# Patient Record
Sex: Male | Born: 1990 | Hispanic: No | Marital: Married | Smoking: Never smoker
Health system: Southern US, Community
[De-identification: ages and names within clinical notes are randomized; demographics above are authoritative.]

## PROBLEM LIST (undated history)

## (undated) DIAGNOSIS — R1013 Epigastric pain: Secondary | ICD-10-CM

## (undated) DIAGNOSIS — A048 Other specified bacterial intestinal infections: Secondary | ICD-10-CM

## (undated) DIAGNOSIS — K5900 Constipation, unspecified: Secondary | ICD-10-CM

## (undated) DIAGNOSIS — K219 Gastro-esophageal reflux disease without esophagitis: Secondary | ICD-10-CM

## (undated) DIAGNOSIS — J45909 Unspecified asthma, uncomplicated: Secondary | ICD-10-CM

## (undated) HISTORY — DX: Gastro-esophageal reflux disease without esophagitis: K21.9

## (undated) HISTORY — PX: NO PAST SURGERIES: SHX2092

## (undated) HISTORY — DX: Epigastric pain: R10.13

## (undated) HISTORY — DX: Constipation, unspecified: K59.00

## (undated) HISTORY — DX: Unspecified asthma, uncomplicated: J45.909

---

## 2020-03-06 LAB — COMPREHENSIVE METABOLIC PANEL
ALT: 15 U/L (ref 0–41)
AST: 18 U/L (ref 0–40)
Albumin/Globulin Ratio: 2.1 mmol/L (ref 1.00–2.70)
Albumin: 4.4 g/dL (ref 3.5–5.2)
Alk Phosphatase: 56 U/L (ref 40–130)
Anion Gap: 8 mmol/L (ref 2–17)
BUN: 9 mg/dL (ref 6–20)
CO2: 26 mmol/L (ref 22–29)
Calcium: 9.2 mg/dL (ref 8.6–10.0)
Chloride: 103 mmol/L (ref 98–107)
Creatinine: 0.6 mg/dL — ABNORMAL LOW (ref 0.7–1.3)
GFR African American: 160 mL/min/{1.73_m2} (ref >=90)
GFR Non-African American: 138 mL/min/{1.73_m2} (ref >=90)
Globulin: 2.1 g/dL (ref 1.9–4.4)
Glucose: 118 mg/dL — ABNORMAL HIGH (ref 70–99)
OSMOLALITY CALCULATED: 274 mOsm/kg (ref 270–287)
Potassium: 4.2 mmol/L (ref 3.5–5.3)
Sodium: 137 mmol/L (ref 135–145)
Total Bilirubin: 0.66 mg/dL (ref 0.00–1.20)
Total Protein: 6.5 g/dL (ref 6.4–8.3)

## 2020-03-06 LAB — CBC WITH AUTO DIFFERENTIAL
Absolute Baso #: 0 10*3/uL (ref 0.0–0.2)
Absolute Eos #: 0.3 10*3/uL (ref 0.0–0.5)
Absolute Lymph #: 1.9 10*3/uL (ref 1.0–3.2)
Absolute Mono #: 0.3 10*3/uL (ref 0.3–1.0)
Basophils %: 0.8 % (ref 0.0–2.0)
Eosinophils %: 5.7 % (ref 0.0–7.0)
Hematocrit: 44.9 % (ref 38.0–52.0)
Hemoglobin: 15.4 g/dL (ref 13.0–17.3)
Immature Grans (Abs): 0.01 10*3/uL (ref 0.00–0.06)
Immature Granulocytes: 0.2 % (ref 0.0–0.6)
Lymphocytes: 35.8 % (ref 15.0–45.0)
MCH: 27.5 pg (ref 27.0–34.5)
MCHC: 34.3 g/dL (ref 32.0–36.0)
MCV: 80 fL — ABNORMAL LOW (ref 84.0–100.0)
MPV: 10.7 fL (ref 7.2–13.2)
Monocytes: 5.5 % (ref 4.0–12.0)
NRBC Absolute: 0 10*3/uL (ref 0.000–0.012)
NRBC Automated: 0 % (ref 0.0–0.2)
Neutrophils %: 52 % (ref 42.0–74.0)
Neutrophils Absolute: 2.8 10*3/uL (ref 1.6–7.3)
Platelets: 254 10*3/uL (ref 140–440)
RBC: 5.61 x10e6/mcL — ABNORMAL HIGH (ref 4.00–5.60)
RDW: 13.2 % (ref 11.0–16.0)
WBC: 5.3 10*3/uL (ref 3.8–10.6)

## 2020-03-06 LAB — LIPASE: Lipase: 39 U/L (ref 13–60)

## 2020-03-06 NOTE — Discharge Summary (Signed)
 ED Clinical Summary                        [facility]  [address]  [place], [place], [address]  [phone]           PERSON INFORMATION  Name: Rodney Stephens, Rodney Stephens Age:  30 Years DOB: [birth year]   Sex: Male Language: Other PCP: PCP,  NONE   Marital Status: Married Phone: [phone] Med Service: [facility]   MRN: [id number] Acct# [id number] Arrival: 03/06/2020 17:17:00   Visit Reason: Abdominal pain; ABD PAIN/NO ENGLISH Acuity: 3 LOS: 000 02:00   Address:    [address] [place] [address]   Diagnosis:    Gastritis; Pain in the abdomen  Medications:          New Medications  Printed Prescriptions  sucralfate (Carafate 1 g/10 mL oral suspension) 10 Milliliter Oral (given by mouth) four times a day (before meals and at bedtime) for 7 Days. Refills: 0.  Last Dose:____________________  Medications That Were Updated - Follow Current Instructions  Printed Prescriptions  Current omeprazole (omeprazole 20 mg oral delayed release capsule) 1 Capsules Oral (given by mouth) every day. Refills: 2.  Last Dose:____________________    Other Medications  Current omeprazole Oral (given by mouth) every day.  Last Dose:____________________        Medications Administered During Visit:                Medication Dose Route   ketorolac 15 mg IV Push   Sodium Chloride 0.9% 500 mL IV Piggyback   lidocaine topical 15 mL Oral   Al hydroxide/Mg hydroxide/simethicone 30 mL Oral   famotidine 20 mg IV Push               Allergies      No Known Medication Allergies      Major Tests and Procedures:  The following procedures and tests were performed during your ED visit.  COMMON PROCEDURES%>  COMMON PROCEDURES COMMENTS%>                PROVIDER INFORMATION               Provider Role Assigned Sampson Prey, RN, Donald ED Nurse 03/06/2020 17:28:05    GAETANO NETTER E-MD ED Provider 03/06/2020 17:29:56        Attending Physician:  GAETANO NETTER E-MD      Admit Doc  GAETANO NETTER E-MD      Consulting Doc       VITALS INFORMATION  Vital Sign Triage Latest   Temp Oral ORAL_1%> ORAL%>   Temp Temporal TEMPORAL_1%> TEMPORAL%>   Temp Intravascular INTRAVASCULAR_1%> INTRAVASCULAR%>   Temp Axillary AXILLARY_1%> AXILLARY%>   Temp Rectal RECTAL_1%> RECTAL%>   02 Sat 99 % 98 %   Respiratory Rate RATE_1%> RATE%>   Peripheral Pulse Rate PULSE RATE_1%>69 bpm PULSE RATE%>   Apical Heart Rate HEART RATE_1%> HEART RATE%>   Blood Pressure BLOOD PRESSURE_1%>/ BLOOD PRESSURE_1%>92 mmHg BLOOD PRESSURE%> / BLOOD PRESSURE%>92 mmHg                 Immunizations      No Immunizations Documented This Visit          DISCHARGE INFORMATION   Discharge Disposition: H Outpt-Sent Home   Discharge Location:  Home   Discharge Date and Time:  03/06/2020 19:17:21   ED Checkout Date and Time:  03/06/2020 19:17:21  DEPART REASON INCOMPLETE INFORMATION               Depart Action Incomplete Reason   Interactive View/I&O Recently assessed               Problems      No Problems Documented              Smoking Status      No Smoking Status Documented         PATIENT EDUCATION INFORMATION  Instructions:     Peptic Ulcer(Arabic); Abdominal Pain, Adult(Arabic)     Follow up:                   With: Address: When:   [facility] Call for appt & office location   [phone] Business (1) Within 1 to 2 weeks       With: Address: When:   Follow up with your primary care doctor  Within 1 to 2 weeks   Comments:   For reassement and follow up. If you dont have a doctor please call [phone] and Florie will help you get one.              ED PROVIDER DOCUMENTATION     Patient:   Rodney Stephens             MRN: [id number]            FIN: [id number]               Age:   14 years     Sex:  Male     DOB:  April 17, 1990   Associated Diagnoses:   Pain in the abdomen; Gastritis   Author:   LAUERMAN,  NICHOLAS E-MD      Basic Information   Time seen: Provider Seen (ST)   ED Provider/Time:    GAETANO NETTER E-MD / 03/06/2020  17:29  .   History source: Patient.   Arrival mode: Walking, Ambulance-ALS.   History limitation: Language barrier, [employer] video interpreter used during history and exam and discussion of findings, plan and final dispostion of patient..   Additional information: Chief Complaint from Nursing Triage Note   Chief Complaint  Chief Complaint: Pt refers of increased abdominal pain. Causing headache and feeling increasingly weak for about 15 days after not taking medications (omeprazole). (03/06/20 17:20:00).      History of Present Illness   The patient presents with abdominal pain.  The onset was chronic and started 1.5 years ago. has been on medication until 15 days ago than symptoms started again. was on omeprazole.  The course/duration of symptoms is constant.  The character of symptoms is dull.  The degree at onset was moderate.  The Location of pain at onset was mid epigastric.  The degree at present is severe.  The Location of pain at present is mid epigastric.  Radiating pain: none. The exacerbating factor is eating.  Therapy today: none.  Associated symptoms: nausea, denies chest pain, denies vomiting, denies diarrhea, denies back pain, denies shortness of breath, denies fever and denies chills.        Review of Systems   Constitutional symptoms:  No fever,    Respiratory symptoms:  No shortness of breath, no cough.    Cardiovascular symptoms:  No chest pain,    Gastrointestinal symptoms:  Abdominal pain, nausea, no vomiting, no diarrhea.    Genitourinary symptoms:  No dysuria, no hematuria.    Musculoskeletal symptoms:  No back pain,              Additional review of systems information: All other systems reviewed and otherwise negative.      Health Status   Allergies:    Allergic Reactions (Selected)  No Known Medication Allergies.   Medications:  (Selected)   Inpatient Medications  Ordered  Pepcid: 20 mg, 2 mL, IV Push, Once  Sodium Chloride 0.9% bolus: 500 mL, 500 mL/hr, IV Piggyback, Once  Toradol: 15 mg, 1  mL, IV Push, Once  aluminum hydroxide/magnesium hydroxide/simethicone 200 mg-200 mg-20 mg/5 mL oral suspension: 30 mL, Oral, Once  lidocaine 2% mucous membrane solution: 15 mL, Oral, Once  Documented Medications  Documented  omeprazole: Oral, Daily, 0 Refill(s).      Past Medical/ Family/ Social History   Medical history:    No active or resolved past medical history items have been selected or recorded., Reviewed as documented in chart.   Surgical history:    No active procedure history items have been selected or recorded., Reviewed as documented in chart.   Family history:    No family history items have been selected or recorded..   Social history:    Social & Psychosocial Habits    No Data Available  , Reviewed as documented in chart.   Problem list:    No qualifying data available  , per nurse's notes.      Physical Examination               Vital Signs   Vital Signs   03/06/2020 17:20 EST Systolic Blood Pressure 134 mmHg    Diastolic Blood Pressure 92 mmHg  HI    Temperature Oral 36.8 degC    Heart Rate Monitored 75 bpm    Respiratory Rate 14 br/min    SpO2 99 %   .   Measurements   03/06/2020 17:27 EST Body Mass Index est meas 22.21 kg/m2    Body Mass Index Measured 22.21 kg/m2   03/06/2020 17:20 EST Height/Length Measured 172 cm    Weight Dosing 65.7 kg   .   Basic Oxygen Information   03/06/2020 17:20 EST Oxygen Therapy Room air    SpO2 99 %   .   General:  Alert, no acute distress.    Skin:  Warm, dry, pink, intact.    Head:  Normocephalic, atraumatic.    Neck:  Supple.   Eye:  Extraocular movements are intact.   Cardiovascular:  Regular rate and rhythm, No murmur, Normal peripheral perfusion.    Respiratory:  Lungs are clear to auscultation, respirations are non-labored.    Gastrointestinal:  Soft, Nontender, Non distended, Guarding: Negative, Rebound: Negative.    Back:  Nontender, Normal range of motion, no cvat.    Neurological:  Alert and oriented to person, place, time, and situation, No focal neurological  deficit observed.    Psychiatric:  Cooperative.      Medical Decision Making   Differential Diagnosis:  Abdominal pain, hepatitis, pancreatitis, gastroesophageal reflux disease, peptic ulcer disease, gastritis.    Rationale:  Chronic abdominal pain for last year and a half worse with eating in the epigastrium it sounds like reflux gastritis or peptic ulcer disease.  He was on omeprazole 15 days ago he ran out pain became worse.  He was better when he was on the medicine.  He has a benign abdominal exam with no tenderness.  Given the length of symptoms his benign exam I do not think he  needs any emergent imaging.  Plan for Pepcid and a GI cocktail here we will check baseline labs since we have never seen him before and plan for outpatient referral to PCP and GI.SABRA   Documents reviewed:  Emergency department nurses' notes, vital signs.    Results review:  Lab results : Lab View   03/06/2020 18:31 EST Estimated Creatinine Clearance 171.85 mL/min   03/06/2020 17:34 EST WBC 5.3 x10e3/mcL    RBC 5.61 x10e6/mcL  HI    Hgb 15.4 g/dL    HCT 55.0 %    MCV 19.9 fL  LOW    MCH 27.5 pg    MCHC 34.3 g/dL    RDW 86.7 %    Platelet 254 x10e3/mcL    MPV 10.7 fL    Neutro Auto 52.0 %    Neutro Absolute 2.8 x10e3/mcL    Immature Grans Percent 0.2 %    Immature Grans Absolute 0.01 x10e3/mcL    Lymph Auto 35.8 %    Lymph Absolute 1.9 x10e3/mcL    Mono Auto 5.5 %    Mono Absolute 0.3 x10e3/mcL    Eosinophil Percent 5.7 %    Eos Absolute 0.3 x10e3/mcL    Basophil Auto 0.8 %    Baso Absolute 0.0 x10e3/mcL    NRBC Absolute Auto 0.000 x10e3/mcL    NRBC Percent Auto 0.0 %    Sodium Lvl 137 mmol/L    Potassium Lvl 4.2 mmol/L    Chloride 103 mmol/L    CO2 26 mmol/L    Glucose Random 118 mg/dL  HI    BUN 9 mg/dL    Creatinine Lvl 0.6 mg/dL  LOW    AGAP 8 mmol/L    Osmolality Calc 274 mOsm/kg    Calcium Lvl 9.2 mg/dL    Protein Total 6.5 g/dL    Albumin Lvl 4.4 g/dL    Globulin Calc 2.1 g/dL    AG Ratio Calc 7.89 mmol/L    Alk Phos 56 unit/L    AST 18  unit/L    ALT 15 unit/L    eGFR AA 160 mL/min/1.41m    eGFR Non-AA 138 mL/min/1.27m    Bili Total 0.66 mg/dL    Lipase Lvl 39 unit/L   , Interpretation Normal results.       Reexamination/ Reevaluation   Vital signs   Basic Oxygen Information   03/06/2020 17:20 EST Oxygen Therapy Room air    SpO2 99 %         Impression and Plan   Diagnosis   Pain in the abdomen (ICD10-CM R10.9, Discharge, Medical)   Gastritis (ICD10-CM K29.70, Discharge, Medical)   Plan   Disposition: Discharged: to home.    Patient was given the following educational materials: Abdominal Pain, Adult(Arabic), Peptic Ulcer(Arabic).    Follow up with: Follow up with your primary care doctor Within 1 to 2 weeks For reassement and follow up. If you dont have a doctor please call [phone] and Florie will help you get one.; [facility] Within 1 to 2 weeks.    Counseled: Based on the patient's history, exam, and diagnostic evaluation, there is no indication for emergent surgery or inpatient treatment. It is understood by the patient/guardian that if the symptoms persist or worsen they need to return immediately for re-evaluation.SABRA

## 2020-03-06 NOTE — ED Notes (Signed)
 ED Patient Summary              [facility] Emergency Department  [address], [place] [address]  [phone]  Discharge Instructions (Patient)  Name: Rodney Stephens, Rodney Stephens  DOB:  [birth year]                   MRN: [id number]                   FIN: [id number]  Reason For Visit: Abdominal pain; ABD PAIN/NO ENGLISH  Final Diagnosis: Gastritis; Pain in the abdomen     Visit Date: 03/06/2020 17:17:00  Address: [address] [place] [address]  Phone: [phone]     Emergency Department Providers:         Primary Physician:      LAUERMAN, NICHOLAS E         [facility] would like to thank you for allowing us  to assist you with your healthcare needs. The following includes patient education materials and information regarding your injury/illness.     Follow-up Instructions:  You were seen today on an emergency basis. Please contact your primary care doctor for a follow up appointment. If you received a referral to a specialist doctor, it is important you follow-up as instructed.    It is important that you call your follow-up doctor to schedule and confirm the location of your next appointment. Your doctor may practice at multiple locations. The office location of your follow-up appointment may be different to the one written on your discharge instructions.    If you do not have a primary care doctor, please call [phone] for help in finding a [facility] Provider. For help in finding a specialist doctor, please call [phone].    If your condition gets worse before your follow-up with your primary care doctor or specialist, please return to the Emergency Department.      Coronavirus 2019 (COVID-19) Reminders:     Patients age 6 - 17, with parental consent, and patients over age 44 can make an appointment for a COVID-19 vaccine. Patients can contact their [facility] doctors' offices to schedule an appointment to receive the COVID-19  vaccine. Patients who do not have a [facility] physician can call [phone] to schedule vaccination appointments.      Follow Up Appointments:  Primary Care Provider:     Name: PCP,  NONE     Phone:                  With: Address: When:   [facility] Call for appt & office location   [phone] Business (1) Within 1 to 2 weeks       With: Address: When:   Follow up with your primary care doctor  Within 1 to 2 weeks   Comments:   For reassement and follow up. If you dont have a doctor please call [phone] and Florie will help you get one.                   New Medications  Printed Prescriptions  sucralfate (Carafate 1 g/10 mL oral suspension) 10 Milliliter Oral (given by mouth) four times a day (before meals and at bedtime) for 7 Days. Refills: 0.  Last Dose:____________________  Medications That Were Updated - Follow Current Instructions  Printed Prescriptions  Current omeprazole (omeprazole 20 mg oral delayed release capsule) 1 Capsules Oral (given  by mouth) every day. Refills: 2.  Last Dose:____________________    Other Medications  Current omeprazole Oral (given by mouth) every day.  Last Dose:____________________        Allergy Info: No Known Medication Allergies     Discharge Additional Information          Discharge Patient 03/06/20 18:44:00 EST      Patient Education Materials:       ---------------------------------------------------------------------------------------------------------------------  [facility]) encourages you to self-enroll in the [facility] Patient Portal.  [facility] Patient Portal will allow you to manage your personal health information securely from your own electronic device now and in the future.  To begin your Patient Portal enrollment process, please visit [email or web address] Click on "Sign up now" under [facility].  If you find that you need additional assistance on the [facility]  Patient Portal or need a copy of your medical records, please call the [facility] Medical Records Office at [phone].  Comment:

## 2020-03-06 NOTE — ED Notes (Signed)
ED Triage Note       ED Secondary Triage Entered On:  03/06/2020 17:29 EST    Performed On:  03/06/2020 17:28 EST by Buiocchi, RN, Shawna Clamp Information   Barriers to Learning :   Language barrier   COVID-19 Vaccine Status :   2 Doses received   ED Home Meds Section :   Document assessment   [facility] ED Fall Risk Section :   Document assessment   ED Advance Directives Section :   Document assessment   ED Palliative Screen :   N/A (prefilled for <28yo)   Buiocchi, RN, Jill Side - 03/06/2020 17:28 EST   (As Of: 03/06/2020 17:29:42 EST)   Diagnoses(Active)    Abdominal pain  Date:   03/06/2020 ; Diagnosis Type:   Reason For Visit ; Confirmation:   Complaint of ; Clinical Dx:   Abdominal pain ; Classification:   Medical ; Clinical Service:   Emergency medicine ; Code:   PNED ; Probability:   0 ; Diagnosis Code:   [id number]             -    Procedure History   (As Of: 03/06/2020 17:29:42 EST)     [facility] Fall Risk Assessment Tool   Hx of falling last 3 months ED Fall :   No   Patient confused or disoriented ED Fall :   No   Patient intoxicated or sedated ED Fall :   No   Patient impaired gait ED Fall :   No   Use a mobility assistance device ED Fall :   No   Patient altered elimination ED Fall :   No   [facility] ED Fall Score :   0    Buiocchi, RNJill Side - 03/06/2020 17:28 EST   ED Advance Directive   Advance Directive :   No   Buiocchi, RN, Jill Side - 03/06/2020 17:28 EST

## 2020-03-06 NOTE — ED Notes (Signed)
ED Patient Education Note     Patient Education Materials Follows:

## 2020-03-06 NOTE — ED Notes (Signed)
ED Pre-Arrival Note        Pre-Arrival Summary    Name:  cc6,    Current Date:  03/06/2020 17:19:59 EST  Gender:  Male  Date of Birth:    Age:  30  Pre-Arrival Type:  EMS  ETA:  03/06/2020 17:36:00 EST  Primary Care Physician:    Presenting Problem:  abd pain/ no english  Pre-Arrival User:  Buiocchi, RN, Jill Side  Referring Source:    Location:  [place]  BP:  145/84  HR:  75  Resp:  13  O2:  96RA            PreArrival Communication Form  Emergency Department        Additional Patient Information:        Orders:  [    ] CBC                                            [     ] CT Head no contrast  [    ] BMP                                           [     ] CT Abdomen/Pelvis no contrast  [    ] PT/INR                                       [     ] CT Abdomen/Pelvis IV contrast, w/ oral contrast  [    ] Troponin                                   [     ] CT Abdomen/Pelvis IV contrast, no oral contrast  [    ] BNP                                            [     ] See ordersheet  [    ] CXR                                             [     ] Other:__________________________  [    ] EKG

## 2020-03-06 NOTE — ED Provider Notes (Signed)
Abdominal Pain *ED        Patient:   Rodney Stephens, Rodney Stephens             MRN: [id number]            FIN: [id number]               Age:   30 years     Sex:  Male     DOB:  [birth year]   Associated Diagnoses:   Pain in the abdomen; Gastritis   Author:   Rodolph Bong E-MD      Basic Information   Time seen: Provider Seen (ST)   ED Provider/Time:    Rodolph Bong E-MD / 03/06/2020 17:29  .   History source: Patient.   Arrival mode: Walking, Ambulance-ALS.   History limitation: Language barrier, [employer] video interpreter used during history and exam and discussion of findings, plan and final dispostion of patient..   Additional information: Chief Complaint from Nursing Triage Note   Chief Complaint  Chief Complaint: Pt refers of increased abdominal pain. Causing headache and feeling increasingly weak for about 15 days after not taking medications (omeprazole). (03/06/20 17:20:00).      History of Present Illness   The patient presents with abdominal pain.  The onset was chronic and started 1.5 years ago. has been on medication until 15 days ago than symptoms started again. was on omeprazole.  The course/duration of symptoms is constant.  The character of symptoms is dull.  The degree at onset was moderate.  The Location of pain at onset was mid epigastric.  The degree at present is severe.  The Location of pain at present is mid epigastric.  Radiating pain: none. The exacerbating factor is eating.  Therapy today: none.  Associated symptoms: nausea, denies chest pain, denies vomiting, denies diarrhea, denies back pain, denies shortness of breath, denies fever and denies chills.        Review of Systems   Constitutional symptoms:  No fever,    Respiratory symptoms:  No shortness of breath, no cough.    Cardiovascular symptoms:  No chest pain,    Gastrointestinal symptoms:  Abdominal pain, nausea, no vomiting, no diarrhea.    Genitourinary symptoms:  No dysuria, no hematuria.    Musculoskeletal symptoms:  No back pain,               Additional review of systems information: All other systems reviewed and otherwise negative.      Health Status   Allergies:    Allergic Reactions (Selected)  No Known Medication Allergies.   Medications:  (Selected)   Inpatient Medications  Ordered  Pepcid: 20 mg, 2 mL, IV Push, Once  Sodium Chloride 0.9% bolus: 500 mL, 500 mL/hr, IV Piggyback, Once  Toradol: 15 mg, 1 mL, IV Push, Once  aluminum hydroxide/magnesium hydroxide/simethicone 200 mg-200 mg-20 mg/5 mL oral suspension: 30 mL, Oral, Once  lidocaine 2% mucous membrane solution: 15 mL, Oral, Once  Documented Medications  Documented  omeprazole: Oral, Daily, 0 Refill(s).      Past Medical/ Family/ Social History   Medical history:    No active or resolved past medical history items have been selected or recorded., Reviewed as documented in chart.   Surgical history:    No active procedure history items have been selected or recorded., Reviewed as documented in chart.   Family history:    No family history items have been selected or recorded..   Social history:  Social & Psychosocial Habits    No Data Available  , Reviewed as documented in chart.   Problem list:    No qualifying data available  , per nurse's notes.      Physical Examination               Vital Signs   Vital Signs   03/06/2020 17:20 EST Systolic Blood Pressure 134 mmHg    Diastolic Blood Pressure 92 mmHg  HI    Temperature Oral 36.8 degC    Heart Rate Monitored 75 bpm    Respiratory Rate 14 br/min    SpO2 99 %   .   Measurements   03/06/2020 17:27 EST Body Mass Index est meas 22.21 kg/m2    Body Mass Index Measured 22.21 kg/m2   03/06/2020 17:20 EST Height/Length Measured 172 cm    Weight Dosing 65.7 kg   .   Basic Oxygen Information   03/06/2020 17:20 EST Oxygen Therapy Room air    SpO2 99 %   .   General:  Alert, no acute distress.    Skin:  Warm, dry, pink, intact.    Head:  Normocephalic, atraumatic.    Neck:  Supple.   Eye:  Extraocular movements are intact.   Cardiovascular:   Regular rate and rhythm, No murmur, Normal peripheral perfusion.    Respiratory:  Lungs are clear to auscultation, respirations are non-labored.    Gastrointestinal:  Soft, Nontender, Non distended, Guarding: Negative, Rebound: Negative.    Back:  Nontender, Normal range of motion, no cvat.    Neurological:  Alert and oriented to person, place, time, and situation, No focal neurological deficit observed.    Psychiatric:  Cooperative.      Medical Decision Making   Differential Diagnosis:  Abdominal pain, hepatitis, pancreatitis, gastroesophageal reflux disease, peptic ulcer disease, gastritis.    Rationale:  Chronic abdominal pain for last year and a half worse with eating in the epigastrium it sounds like reflux gastritis or peptic ulcer disease.  He was on omeprazole 15 days ago he ran out pain became worse.  He was better when he was on the medicine.  He has a benign abdominal exam with no tenderness.  Given the length of symptoms his benign exam I do not think he needs any emergent imaging.  Plan for Pepcid and a GI cocktail here we will check baseline labs since we have never seen him before and plan for outpatient referral to PCP and GI.Marland Kitchen   Documents reviewed:  Emergency department nurses' notes, vital signs.    Results review:  Lab results : Lab View   03/06/2020 18:31 EST Estimated Creatinine Clearance 171.85 mL/min   03/06/2020 17:34 EST WBC 5.3 x10e3/mcL    RBC 5.61 x10e6/mcL  HI    Hgb 15.4 g/dL    HCT 11.9 %    MCV 14.7 fL  LOW    MCH 27.5 pg    MCHC 34.3 g/dL    RDW 82.9 %    Platelet 254 x10e3/mcL    MPV 10.7 fL    Neutro Auto 52.0 %    Neutro Absolute 2.8 x10e3/mcL    Immature Grans Percent 0.2 %    Immature Grans Absolute 0.01 x10e3/mcL    Lymph Auto 35.8 %    Lymph Absolute 1.9 x10e3/mcL    Mono Auto 5.5 %    Mono Absolute 0.3 x10e3/mcL    Eosinophil Percent 5.7 %    Eos Absolute 0.3 x10e3/mcL  Basophil Auto 0.8 %    Baso Absolute 0.0 x10e3/mcL    NRBC Absolute Auto 0.000 x10e3/mcL    NRBC Percent  Auto 0.0 %    Sodium Lvl 137 mmol/L    Potassium Lvl 4.2 mmol/L    Chloride 103 mmol/L    CO2 26 mmol/L    Glucose Random 118 mg/dL  HI    BUN 9 mg/dL    Creatinine Lvl 0.6 mg/dL  LOW    AGAP 8 mmol/L    Osmolality Calc 274 mOsm/kg    Calcium Lvl 9.2 mg/dL    Protein Total 6.5 g/dL    Albumin Lvl 4.4 g/dL    Globulin Calc 2.1 g/dL    AG Ratio Calc 2.10 mmol/L    Alk Phos 56 unit/L    AST 18 unit/L    ALT 15 unit/L    eGFR AA 160 mL/min/1.7m???    eGFR Non-AA 138 mL/min/1.735m??    Bili Total 0.66 mg/dL    Lipase Lvl 39 unit/L   , Interpretation Normal results.       Reexamination/ Reevaluation   Vital signs   Basic Oxygen Information   03/06/2020 17:20 EST Oxygen Therapy Room air    SpO2 99 %         Impression and Plan   Diagnosis   Pain in the abdomen (ICD10-CM R10.9, Discharge, Medical)   Gastritis (ICD10-CM K29.70, Discharge, Medical)   Plan   Disposition: Discharged: to home.    Patient was given the following educational materials: Abdominal Pain, Adult(Arabic), Peptic Ulcer(Arabic).    Follow up with: Follow up with your primary care doctor Within 1 to 2 weeks For reassement and follow up. If you dont have a doctor please call [phone] and RoNestor Lewandowskyill help you get one.; [facility] Within 1 to 2 weeks.    Counseled: Based on the patient's history, exam, and diagnostic evaluation, there is no indication for emergent surgery or inpatient treatment. It is understood by the patient/guardian that if the symptoms persist or worsen they need to return immediately for re-evaluation.. Designer, industrial/productigned on 03/06/2020 06:44 PM EST   ________________________________________________   LARodolph Bong-MD               Modified by: LARodolph Bong-MD on 03/06/2020 05:48 PM EST      Modified by: LARodolph Bong-MD on 03/06/2020 06:44 PM EST

## 2020-03-06 NOTE — ED Notes (Signed)
ED Triage Note       ED Triage Adult Entered On:  03/06/2020 17:27 EST    Performed On:  03/06/2020 17:20 EST by Luanna Salk E-RN               Triage   Numeric Rating Pain Scale :   8   Chief Complaint :   Pt refers of increased abdominal pain. Causing headache and feeling increasingly weak for about 15 days after not taking medications (omeprazole).   [place] Mode of Arrival :   Ambulance   Infectious Disease Documentation :   Document assessment   Temperature Oral :   36.8 degC(Converted to: 98.2 degF)    Heart Rate Monitored :   75 bpm   Respiratory Rate :   14 br/min   Systolic Blood Pressure :   134 mmHg   Diastolic Blood Pressure :   92 mmHg (HI)    SpO2 :   99 %   Oxygen Therapy :   Room air   Patient presentation :   None of the above   Chief Complaint or Presentation suggest infection :   No   Dosing Weight Obtained By :   Measured   Weight Dosing :   65.7 kg(Converted to: 144 lb 14 oz)    Height :   172 cm(Converted to: 5 ft 8 in)    Body Mass Index Dosing :   22 kg/m2   Luanna Salk E-RN - 03/06/2020 17:20 EST   DCP GENERIC CODE   Tracking Acuity :   3   Tracking Group :   ED [employer]   [place],  [place] E-RN - 03/06/2020 17:20 EST   ED General Section :   Document assessment   Pregnancy Status :   Patient denies   Approximate Last Menstrual Period :   Male patient   ED Allergies Section :   Document assessment   ED Reason for Visit Section :   Document assessment   ED Home Meds Section :   Document assessment   Luanna Salk E-RN - 03/06/2020 17:20 EST   PTA/Triage Treatments   ED PTA Pre-Arrival Service :   [facility]   [place],  [place] E-RN - 03/06/2020 17:20 EST   ID Risk Screen Symptoms   Recent Travel History :   No recent travel   Close Contact with COVID-19 ID :   No   Last 14 days COVID-19 ID :   No   TB Symptom Screen :   No symptoms   C. diff Symptom/History ID :   Neither of the above   [place],  [place] E-RN - 03/06/2020 17:20 EST   Allergies   (As Of: 03/06/2020 17:27:21  EST)   Allergies (Active)   No Known Medication Allergies  Estimated Onset Date:   Unspecified ; Created By:   Luanna Salk E-RN; Reaction Status:   Active ; Category:   Drug ; Substance:   No Known Medication Allergies ; Type:   Allergy ; Updated By:   Luanna Salk E-RN; Reviewed Date:   03/06/2020 17:27 EST        Psycho-Social   Last 3 mo, thoughts killing self/others :   Patient denies   Right click within box for Suspected Abuse policy link. :   None   Feels Safe Where Live :   Yes   ED Behavioral Activity Rating Scale :   4 - Quiet  and awake (normal level of activity)   Luanna Salk E-RN - 03/06/2020 17:20 EST   ED Home Med List   Medication List   (As Of: 03/06/2020 17:27:21 EST)   Home Meds    omeprazole  :   omeprazole ; Status:   Documented ; Ordered As Mnemonic:   omeprazole ; Simple Display Line:   Oral, Daily, 0 Refill(s) ; Catalog Code:   omeprazole ; Order Dt/Tm:   03/06/2020 17:24:28 EST            ED Reason for Visit   (As Of: 03/06/2020 17:27:22 EST)   Diagnoses(Active)    Abdominal pain  Date:   03/06/2020 ; Diagnosis Type:   Reason For Visit ; Confirmation:   Complaint of ; Clinical Dx:   Abdominal pain ; Classification:   Medical ; Clinical Service:   Emergency medicine ; Code:   PNED ; Probability:   0 ; Diagnosis Code:   [id number]

## 2020-06-28 LAB — CBC WITH AUTO DIFFERENTIAL
Absolute Baso #: 0 10*3/uL (ref 0.0–0.2)
Absolute Eos #: 0.1 10*3/uL (ref 0.0–0.5)
Absolute Lymph #: 1.2 10*3/uL (ref 1.0–3.2)
Absolute Mono #: 0.2 10*3/uL — ABNORMAL LOW (ref 0.3–1.0)
Basophils %: 0.9 % (ref 0.0–2.0)
Eosinophils %: 4.3 % (ref 0.0–7.0)
Hematocrit: 45 % (ref 38.0–52.0)
Hemoglobin: 15.6 g/dL (ref 13.0–17.3)
Immature Grans (Abs): 0.01 10*3/uL (ref 0.00–0.06)
Immature Granulocytes: 0.3 % (ref 0.0–0.6)
Lymphocytes: 35.8 % (ref 15.0–45.0)
MCH: 28.4 pg (ref 27.0–34.5)
MCHC: 34.7 g/dL (ref 32.0–36.0)
MCV: 81.8 fL — ABNORMAL LOW (ref 84.0–100.0)
MPV: 10.3 fL (ref 7.2–13.2)
Monocytes: 5.2 % (ref 4.0–12.0)
NRBC Absolute: 0 10*3/uL (ref 0.000–0.012)
NRBC Automated: 0 % (ref 0.0–0.2)
Neutrophils %: 53.5 % (ref 42.0–74.0)
Neutrophils Absolute: 1.8 10*3/uL (ref 1.6–7.3)
Platelets: 239 10*3/uL (ref 140–440)
RBC: 5.5 x10e6/mcL (ref 4.00–5.60)
RDW: 12.4 % (ref 11.0–16.0)
WBC: 3.3 10*3/uL — ABNORMAL LOW (ref 3.8–10.6)

## 2020-06-28 LAB — LACTIC ACID: Lactic Acid: 0.8 mmol/L (ref 0.5–2.0)

## 2020-06-28 LAB — COMPREHENSIVE METABOLIC PANEL
ALT: 40 U/L (ref 0–50)
AST: 32 U/L (ref 0–50)
Albumin/Globulin Ratio: 2.06 (ref 1.00–2.70)
Albumin: 4.7 g/dL (ref 3.5–5.2)
Alk Phosphatase: 56 U/L (ref 40–130)
Anion Gap: 9 mmol/L (ref 2–17)
BUN: 18 mg/dL (ref 6–20)
CO2: 27 mmol/L (ref 22–29)
Calcium: 9.4 mg/dL (ref 8.6–10.0)
Chloride: 100 mmol/L (ref 98–107)
Creatinine: 0.7 mg/dL (ref 0.7–1.3)
Est, Glom Filt Rate: 130 mL/min/1.73m?? (ref >=90)
Globulin: 2.3 g/dL (ref 1.9–4.4)
Glucose: 114 mg/dL — ABNORMAL HIGH (ref 70–99)
OSMOLALITY CALCULATED: 274 mOsm/kg (ref 270–287)
Potassium: 4.6 mmol/L (ref 3.5–5.3)
Sodium: 136 mmol/L (ref 135–145)
Total Bilirubin: 0.71 mg/dL (ref 0.00–1.20)
Total Protein: 7 g/dL (ref 6.4–8.3)

## 2020-06-28 LAB — LIPASE: Lipase: 41 U/L (ref 13–60)

## 2020-06-28 NOTE — ED Notes (Signed)
ED Triage Note       ED Secondary Triage Entered On:  06/28/2020 13:41 EDT    Performed On:  06/28/2020 13:40 EDT by Everardo All, RN, Devonne Doughty               General Information   Barriers to Learning :   None evident   Languages :   English   ED Home Meds Section :   Document assessment   [facility] ED Fall Risk Section :   Document assessment   ED Advance Directives Section :   Document assessment   ED Palliative Screen :   N/A (prefilled for <65yo)   Everardo All RN, Devonne Doughty - 06/28/2020 13:40 EDT   (As Of: 06/28/2020 13:41:39 EDT)   Problems(Active)    Asthma (SNOMED CT  :[id number] )  Name of Problem:   Asthma ; Recorder:   Everardo All RN, Devonne Doughty; Confirmation:   Confirmed ; Classification:   Patient Stated ; Code:   [id number] ; Contributor System:   Dietitian ; Last Updated:   06/28/2020 13:41 EDT ; Life Cycle Date:   06/28/2020 ; Life Cycle Status:   Active ; Vocabulary:   SNOMED CT          Diagnoses(Active)    Abdominal pain  Date:   06/28/2020 ; Diagnosis Type:   Reason For Visit ; Confirmation:   Complaint of ; Clinical Dx:   Abdominal pain ; Classification:   Medical ; Clinical Service:   Emergency medicine ; Code:   PNED ; Probability:   0 ; Diagnosis Code:   [id number]             -    Procedure History   (As Of: 06/28/2020 13:41:39 EDT)     Phoe[facility] Perch Fall Risk Assessment Tool   Hx of falling last 3 months ED Fall :   No   Everardo All RN, Devonne Doughty - 06/28/2020 13:40 EDT   ED Advance Directive   Advance Directive :   No   Everardo All RN, Devonne Doughty - 06/28/2020 13:40 EDT   Med Hx   Medication List   (As Of: 06/28/2020 13:41:39 EDT)   Normal Order    iopamidol 61% Inj Soln 100 mL  :   iopamidol 61% Inj Soln 100 mL ; Status:   Ordered ; Ordered As Mnemonic:   Isovue-300 ; Simple Display Line:   100 mL, IV Contrast, Once ; Ordering Provider:   Levy Pupa CLIVE-MD; Catalog Code:   iopamidol ; Order Dt/Tm:   06/28/2020 12:37:58 EDT          Sodium Chloride 0.9% intravenous solution Bolus  :   Sodium Chloride 0.9%  intravenous solution Bolus ; Status:   Ordered ; Ordered As Mnemonic:   Sodium Chloride 0.9% bolus ; Simple Display Line:   1,000 mL, 1000 mL/hr, IV Piggyback, Once ; Ordering Provider:   GREAVES,  ROBERT CLIVE-MD; Catalog Code:   Sodium Chloride 0.9% ; Order Dt/Tm:   06/28/2020 12:31:35 EDT            Home Meds    fluticasone  :   fluticasone ; Status:   Documented ; Ordered As Mnemonic:   Flovent Diskus 100 mcg inhalation powder ; Simple Display Line:   1 puffs, Inhale, BID, 120 EA, 0 Refill(s) ; Catalog Code:   fluticasone ; Order Dt/Tm:   06/28/2020 13:41:10 EDT          montelukast  :   montelukast ; Status:   Documented ; Ordered As  Mnemonic:   montelukast 10 mg oral tablet ; Simple Display Line:   10 mg, 1 tabs, Oral, qPM, 0 Refill(s) ; Catalog Code:   montelukast ; Order Dt/Tm:   06/28/2020 13:41:10 EDT          omeprazole  :   omeprazole ; Status:   Documented ; Ordered As Mnemonic:   omeprazole 20 mg oral delayed release capsule ; Simple Display Line:   20 mg, 1 caps, Oral, Daily, 0 Refill(s) ; Catalog Code:   omeprazole ; Order Dt/Tm:   06/28/2020 13:41:10 EDT

## 2020-06-28 NOTE — ED Notes (Signed)
ED Patient Education Note     Patient Education Materials Follows:

## 2020-06-28 NOTE — ED Notes (Signed)
 ED Patient Summary              [facility] Emergency Department  [address], [place] [address]  [phone]  Discharge Instructions (Patient)  Name: Rodney Stephens, Rodney Stephens  DOB: [birth year]                   MRN: [id number]                   FIN: [id number]  Reason For Visit: Abdominal pain; AB PAIN  Final Diagnosis: Abdominal discomfort     Visit Date: 06/28/2020 12:24:00  Address: [address] [place] [address]  Phone: [phone]     Emergency Department Providers:         Primary Physician:      REUBEN CHARLESTON [facility] would like to thank you for allowing us  to assist you with your healthcare needs. The following includes patient education materials and information regarding your injury/illness.     Follow-up Instructions:  You were seen today on an emergency basis. Please contact your primary care doctor for a follow up appointment. If you received a referral to a specialist doctor, it is important you follow-up as instructed.    It is important that you call your follow-up doctor to schedule and confirm the location of your next appointment. Your doctor may practice at multiple locations. The office location of your follow-up appointment may be different to the one written on your discharge instructions.    If you do not have a primary care doctor, please call [phone] for help in finding a [facility] Provider. For help in finding a specialist doctor, please call [phone].    If your condition gets worse before your follow-up with your primary care doctor or specialist, please return to the Emergency Department.      Coronavirus 2019 (COVID-19) Reminders:     Patients age 59 - 71, with parental consent, and patients over age 70 can make an appointment for a COVID-19 vaccine. Patients can contact their [facility] doctors' offices to schedule an appointment to receive the COVID-19 vaccine. Patients  who do not have a [facility] physician can call [phone] to schedule vaccination appointments.      Follow Up Appointments:  Primary Care Provider:     Name: PCP,  NONE     Phone:                  With: Address: When:   Follow up with primary care provider  Within 1 to 2 weeks   Comments:       Return to ED if symptoms worsen. Call [phone] to establish a Primary Care Doctor. You may also use the online tool at Gum[employer].nl       With: Address: When:   [facility]  Within 2 to 4 weeks              Post [facility] SERVICES%>          New Medications  Printed Prescriptions  hyoscyamine (Levsin 0.125 mg oral tablet) 1 Tabs Oral (given by mouth) 4 times a day as needed for spasm for 3 Days. Refills: 0.  Last Dose:____________________  polyethylene glycol 3350 (MiraLax oral powder for reconstitution) 17 Gram Oral (given by mouth) every day for 14 Days. Refills: 0.  Last Dose:____________________  Medications that have not  changed  Other Medications  fluticasone (Flovent Diskus 100 mcg inhalation powder) 1 Puffs Inhale (breathe in) 2 times a day.  Last Dose:____________________  montelukast (montelukast 10 mg oral tablet) 1 Tabs Oral (given by mouth) once a day (in the evening).  Last Dose:____________________  omeprazole (omeprazole 20 mg oral delayed release capsule) 1 Capsules Oral (given by mouth) every day.  Last Dose:____________________      Allergy Info: No Known Medication Allergies     Discharge Additional Information          Discharge Patient 06/28/20 15:26:00 EDT      Patient Education Materials:       ---------------------------------------------------------------------------------------------------------------------  [facility] allows patients to review your COVID and other test results as well as discharge documents from any [facility], Emergency Department, surgical center or outpatient lab. Test results are typically  available 36 hours after the test is completed.     [facility] encourages you to self-enroll in the [facility] Patient Portal.     To begin your self-enrollment process, please visit [email or web address] Under [facility], click on "Sign up now".     NOTE: You must be 16 years and older to use [facility] Self-Enroll online. If you are a parent, caregiver, or guardian; you need an invite to access your child's or dependent's health records. To obtain an invite, contact the Medical Records department at [phone] Monday through Friday, 8-4:30, select option 3 . If we receive your call afterhours, we will return your call the next business day.     If you have issues trying to create or access your account, contact [employer] support at [phone] available 7 days a week 24 hours a day.     Comment:

## 2020-06-28 NOTE — ED Provider Notes (Signed)
Abdominal Pain *ED        Patient:   Rodney Stephens, Rodney Stephens             MRN: [id number]            FIN: [id number]               Age:   30 years     Sex:  Male     DOB:  04-May-1990   Associated Diagnoses:   Abdominal discomfort   Author:   Carlisle Beers CLIVE-MD      Basic Information   Time seen: Provider Seen (ST)   ED Provider/Time:    Lincoln Stell,  Mathan Darroch CLIVE-MD / 06/28/2020 12:28  .   Additional information: Chief Complaint from Nursing Triage Note   Chief Complaint   No qualifying data available.Marland Kitchen      History of Present Illness   The patient presents with Patient describes symptoms in [place].  We did obtain translator for further clarification.  Reports has had several weeks of upper abdominal pain.  Worse with eating.  Associated with headache to the back of his head and generalized weakness.  No vomiting or diarrhea  He was seen by [facility] several weeks ago and underwent an EGD and colonoscopy which was essentially normal other than mild esophageal reflux.  He was started on omeprazole.  Patient arrived via EMS stating that omeprazole is not helping and he continues to have symptoms..        Review of Systems   Constitutional symptoms:  No fever,    Skin symptoms:  No jaundice, no rash, no petechiae.    Eye symptoms:  Negative except as documented in HPI.   ENMT symptoms:  Negative except as documented in HPI.   Respiratory symptoms:  No shortness of breath, no orthopnea, no cough, no hemoptysis, no sputum production, no stridor, no wheezing.    Cardiovascular symptoms:  Negative except as documented in HPI, no chest pain, no palpitations, no tachycardia, no syncope, no diaphoresis, no peripheral edema.    Gastrointestinal symptoms:  Abdominal pain, no nausea, no vomiting, no diarrhea, no constipation, no rectal bleeding, no rectal pain.    Genitourinary symptoms:  Negative except as documented in HPI.   Musculoskeletal symptoms:  Negative except as documented in HPI.   Neurologic  symptoms:  No altered level of consciousness, no focal weakness.    Psychiatric symptoms:  Negative except as documented in HPI.   Endocrine symptoms:  Negative except as documented in HPI.   Hematologic/Lymphatic symptoms:  Negative except as documented in HPI.             Additional review of systems information: All other systems reviewed and otherwise negative.      Health Status   Allergies:    Allergic Reactions (Selected)  No Known Medication Allergies.   Medications:  (Selected)   Inpatient Medications  Ordered  Isovue-300: 100 mL, IV Contrast, Once  Sodium Chloride 0.9% bolus: 1,000 mL, 1000 mL/hr, IV Piggyback, Once.      Past Medical/ Family/ Social History   Problem list:    No qualifying data available  .      Physical Examination               Vital Signs               Per nurse's notes.   Oxygen saturation.   General:  Alert, no acute distress, Not ill-appearing,    Skin:  Warm, dry, pink.    Head:  Normocephalic.   Neck:  Supple.   Eye:  Pupils are equal, round and reactive to light.   Cardiovascular:  Regular rate and rhythm, No murmur, Normal peripheral perfusion, No edema.    Respiratory:  Lungs are clear to auscultation, respirations are non-labored, breath sounds are equal, Symmetrical chest wall expansion.    Chest wall:  No tenderness.   Back:  Nontender, Normal range of motion.    Musculoskeletal:  Normal ROM.   Gastrointestinal:  Soft, Nontender, Non distended, Guarding: Negative, Rebound: Negative.    Neurological:  Alert and oriented to person, place, time, and situation, No focal neurological deficit observed, CN II-XII intact, normal motor observed, normal speech observed, normal coordination observed.    Psychiatric:  Cooperative, appropriate mood & affect.       Medical Decision Making   Documents reviewed:  Emergency department nurses' notes.   Results review:  Lab results : Lab View   06/28/2020 13:38 EDT Estimated Creatinine Clearance 76.78 mL/min   06/28/2020 12:46 EDT WBC 3.3  x10e3/mcL  LOW    RBC 5.50 x10e6/mcL    Hgb 15.6 g/dL    HCT 01.1 %    MCV 25.1 fL  LOW    MCH 28.4 pg    MCHC 34.7 g/dL    RDW 02.3 %    Platelet 239 x10e3/mcL    MPV 10.3 fL    Neutro Auto 53.5 %    Neutro Absolute 1.8 x10e3/mcL    Immature Grans Percent 0.3 %    Immature Grans Absolute 0.01 x10e3/mcL    Lymph Auto 35.8 %    Lymph Absolute 1.2 x10e3/mcL    Mono Auto 5.2 %    Mono Absolute 0.2 x10e3/mcL  LOW    Eosinophil Percent 4.3 %    Eos Absolute 0.1 x10e3/mcL    Basophil Auto 0.9 %    Baso Absolute 0.0 x10e3/mcL    NRBC Absolute Auto 0.000 x10e3/mcL    NRBC Percent Auto 0.0 %    Sodium Lvl 136 mmol/L    Potassium Lvl 4.6 mmol/L    Chloride 100 mmol/L    CO2 27 mmol/L    Glucose Random 114 mg/dL  HI    BUN 18 mg/dL    Creatinine Lvl 0.7 mg/dL    AGAP 9 mmol/L    Osmolality Calc 274 mOsm/kg    Calcium Lvl 9.4 mg/dL    Protein Total 7.0 g/dL    Albumin Lvl 4.7 g/dL    Globulin Calc 2.3 g/dL    AG Ratio Calc 9.55    Alk Phos 56 unit/L    AST 32 unit/L    ALT 40 unit/L    eGFR 130 mL/min/1.69m????    Bili Total 0.71 mg/dL    Lipase Lvl 41 unit/L    Lactic Acid Lvl 0.8 mmol/L   .   Radiology results:  Rad Results (ST)   CT Abdomen Pelvis w/ Contrast  ?  06/28/20 14:32:41  CT abdomen and pelvis with contrast: 06/28/20    INDICATION:Abdominal pain, acute, nonlocalized.    COMPARISON: None    TECHNIQUE: Routine protocol (Axial portal venous phase imaging from the lung  bases to the pubic symphysis following IV and oral contrast with coronal  reconstruction.) CT scanning was performed using radiation dose reduction  techniques when appropriate, per system protocols.    FINDINGS:  Lower Thorax: Unremarkable.    Liver: Unremarkable.    Gallbladder/Biliary Tree: Unremarkable.  Spleen: Unremarkable.    Pancreas: Unremarkable.    Adrenal Glands: Unremarkable.    Kidneys/Ureters: Unremarkable.    Gastrointestinal: No bowel obstruction. Unremarkable appendix. Moderate fecal  loading. A few mildly prominent and fluid-filled  small bowel.    Bladder: Unremarkable.    Prostate/Seminal Vesicles: Unremarkable.    Lymph Nodes: Unremarkable.    Vessels: No significant aortoiliac atherosclerotic calcification.    Peritoneum/Retroperitoneum: No drainable fluid collection.    Bones/Soft Tissues: No acute osseous abnormality.    IMPRESSION:    No bowel obstruction. Normal appendix. A few loops of mildly prominent and  fluid-filled small bowel, potentially reflecting an infectious/inflammatory  enteritis in the appropriate clinical context.  ?  Signed By: Waldon Reining S-MD  .   Notes:  Work-up nonspecific.  Discussed with patient labs and imaging.  His English is fair.  We used translation services.  I also spoke with family member via cell phone.  Plan is to try him on some different medicine.  He does have an appointment with [facility] but not until September 8.      Impression and Plan   Diagnosis   Abdominal discomfort (ICD10-CM R10.9, Discharge, Medical)   Plan   Condition: Stable.    Disposition: Discharged: Time  06/28/2020 15:22:00, to home.    Prescriptions: Launch prescriptions   Pharmacy:  Levsin 0.125 mg oral tablet (Prescribe): 0.125 mg, 1 tabs, Oral, QID, for 3 days, PRN: for spasm, 12 tabs, 0 Refill(s)  MiraLax oral powder for reconstitution (Prescribe): 17 g, Oral, Daily, for 14 days, 238 g, 0 Refill(s).    Follow up with: [facility] Within 2 to 4 weeks; Follow up with primary care provider Within 1 to 2 weeks     Return to ED if symptoms worsen.  Call [phone] to establish a Primary Care Doctor.  You may also use the online tool at [employer].    Counseled: Patient, Regarding diagnosis, Regarding diagnostic results, Regarding treatment plan, Regarding prescription, Patient indicated understanding of instructions.    Signature Line     Electronically Signed on 06/28/2020 03:30 PM EDT   ________________________________________________   Carlisle Beers CLIVE-MD               Modified by: Carlisle Beers  CLIVE-MD on 06/28/2020 03:30 PM EDT

## 2020-06-28 NOTE — ED Notes (Signed)
ED Triage Note       ED Triage Adult Entered On:  06/28/2020 12:29 EDT    Performed On:  06/28/2020 12:26 EDT by Leanna Battles, RN, JENNIFER L               Triage   ED Allergies Section :   Document assessment   Suzette Battiest - 06/28/2020 13:38 EDT   Numeric Rating Pain Scale :   8   Chief Complaint :   Pt c/o abdominal pain that radiates to left flank pain. Dx w/H. Pylori on 6/10 by [facility]. Omeprazole not helping.   [place] Mode of Arrival :   Ambulance   Infectious Disease Documentation :   Document assessment   Temperature Oral :   36.8 degC(Converted to: 98.2 degF)    Heart Rate Monitored :   63 bpm   Respiratory Rate :   16 br/min   Systolic Blood Pressure :   110 mmHg   Diastolic Blood Pressure :   76 mmHg   SpO2 :   99 %   Oxygen Therapy :   Room air   Patient presentation :   None of the above   Chief Complaint or Presentation suggest infection :   Yes   Dosing Weight Obtained By :   Patient stated   Weight Dosing :   58.7 kg(Converted to: 129 lb 7 oz)    Height :   117 cm(Converted to: 3 ft 10 in)    Body Mass Index Dosing :   43 kg/m2   MALONE, RN, JENNIFER L - 06/28/2020 12:26 EDT   DCP GENERIC CODE   Tracking Group :   ED [employer]   Tracking Acuity :   3   [place], RN, JENNIFER L - 06/28/2020 12:26 EDT   ED General Section :   Document assessment   Pregnancy Status :   N/A   ED Reason for Visit Section :   Document assessment   Leanna Battles RN, Lise Auer - 06/28/2020 12:26 EDT   PTA/Triage Treatments   ED PTA Pre-Arrival Service :   [facility]   [place], RN, [place] L - 06/28/2020 12:26 EDT   ID Risk Screen Symptoms   Recent Travel History :   No recent travel   TB Symptom Screen :   No symptoms   Last 14 days COVID-19 ID :   No   C. diff Symptom/History ID :   Neither of the above   MALONE, RN, JENNIFER L - 06/28/2020 12:26 EDT   Allergies   (As Of: 06/28/2020 13:38:50 EDT)   Allergies (Active)   No Known Medication Allergies  Estimated Onset Date:   Unspecified ; Created By:    Levy Pupa CLIVE-MD; Reaction Status:   Active ; Category:   Drug ; Substance:   No Known Medication Allergies ; Type:   Allergy ; Updated By:   Levy Pupa CLIVE-MD; Reviewed Date:   06/28/2020 13:38 EDT        Psycho-Social   Last 3 mo, thoughts killing self/others :   Patient denies   Right click within box for Suspected Abuse policy link. :   None   Feels Safe Where Live :   Yes   ED Behavioral Activity Rating Scale :   4 - Quiet and awake (normal level of activity)   MALONE, RN, JENNIFER L - 06/28/2020 12:26 EDT   ED Reason for Visit   (As Of:  06/28/2020 12:29:04 EDT)   Diagnoses(Active)    Abdominal pain  Date:   06/28/2020 ; Diagnosis Type:   Reason For Visit ; Confirmation:   Complaint of ; Clinical Dx:   Abdominal pain ; Classification:   Medical ; Clinical Service:   Emergency medicine ; Code:   PNED ; Probability:   0 ; Diagnosis Code:   [id number]

## 2020-06-28 NOTE — ED Notes (Signed)
ED Pre-Arrival Note        Pre-Arrival Summary    Name:  medic eta ,    Current Date:  06/28/2020 12:29:52 EDT  Gender:    Date of Birth:    Age:  30  Pre-Arrival Type:  EMS  ETA:  06/28/2020 12:35:00 EDT  Primary Care Physician:    Presenting Problem:  abd pain/not English speaking  Pre-Arrival User:  Leanna Battles, RN, JENNIFER L  Referring Source:    Location:  [facility]            PreArrival Communication Form  Emergency Department        Additional Patient Information:        Orders:  [    ] CBC                                            [     ] CT Head no contrast  [    ] BMP                                           [     ] CT Abdomen/Pelvis no contrast  [    ] PT/INR                                       [     ] CT Abdomen/Pelvis IV contrast, w/ oral contrast  [    ] Troponin                                   [     ] CT Abdomen/Pelvis IV contrast, no oral contrast  [    ] BNP                                            [     ] See ordersheet  [    ] CXR                                             [     ] Other:__________________________  [    ] EKG

## 2020-06-28 NOTE — Discharge Summary (Signed)
 ED Clinical Summary                     [facility]  [address]  [place], [place], [address]  [phone]          PERSON INFORMATION  Name: Rodney Stephens, Rodney Stephens Age:  30 Years DOB: 1990-09-25   Sex: Male Language: Other PCP: PCP,  NONE   Marital Status: Single Phone: [phone] Med Service: MED-Medicine   MRN: [id number] Acct# [id number] Arrival: 06/28/2020 12:24:00   Visit Reason: Abdominal pain; AB PAIN Acuity: 3 LOS: 000 03:22   Address:    [address] [place] [address]   Diagnosis:    Abdominal discomfort  Medications:          New Medications  Printed Prescriptions  hyoscyamine (Levsin 0.125 mg oral tablet) 1 Tabs Oral (given by mouth) 4 times a day as needed for spasm for 3 Days. Refills: 0.  Last Dose:____________________  polyethylene glycol 3350 (MiraLax oral powder for reconstitution) 17 Gram Oral (given by mouth) every day for 14 Days. Refills: 0.  Last Dose:____________________  Medications that have not changed  Other Medications  fluticasone (Flovent Diskus 100 mcg inhalation powder) 1 Puffs Inhale (breathe in) 2 times a day.  Last Dose:____________________  montelukast (montelukast 10 mg oral tablet) 1 Tabs Oral (given by mouth) once a day (in the evening).  Last Dose:____________________  omeprazole (omeprazole 20 mg oral delayed release capsule) 1 Capsules Oral (given by mouth) every day.  Last Dose:____________________      Medications Administered During Visit:                Medication Dose Route   iopamidol 100 mL IV Contrast               Allergies      No Known Medication Allergies      Major Tests and Procedures:  The following procedures and tests were performed during your ED visit.  COMMON PROCEDURES%>  COMMON PROCEDURES COMMENTS%>                PROVIDER INFORMATION               Provider Role Assigned Sampson REUBEN CHARLESTON CLIVE-MD ED Provider 06/28/2020 12:28:03    ORION RN, DELON CROME ED Nurse 06/28/2020 12:31:09 06/28/2020 12:31:28    Kassie, RN, Damian ED Nurse 06/28/2020 12:37:45        Attending Physician:  REUBEN CHARLESTON CLIVE-MD      Admit Doc  GREAVES,  ROBERT CLIVE-MD     Consulting Doc       VITALS INFORMATION  Vital Sign Triage Latest   Temp Oral ORAL_1%> ORAL%>   Temp Temporal TEMPORAL_1%> TEMPORAL%>   Temp Intravascular INTRAVASCULAR_1%> INTRAVASCULAR%>   Temp Axillary AXILLARY_1%> AXILLARY%>   Temp Rectal RECTAL_1%> RECTAL%>   02 Sat 99 % 97 %   Respiratory Rate RATE_1%> RATE%>   Peripheral Pulse Rate PULSE RATE_1%> PULSE RATE%>   Apical Heart Rate HEART RATE_1%> HEART RATE%>   Blood Pressure BLOOD PRESSURE_1%>/ BLOOD PRESSURE_1%>76 mmHg BLOOD PRESSURE%> / BLOOD PRESSURE%>76 mmHg                 Immunizations      No Immunizations Documented This Visit          DISCHARGE INFORMATION   Discharge Disposition: H Outpt-Sent Home   Discharge Location:  Home   Discharge Date and Time:  06/28/2020 15:46:50  ED Checkout Date and Time:  06/28/2020 15:46:50     DEPART REASON INCOMPLETE INFORMATION               Depart Action Incomplete Reason   Interactive View/I&O Recently assessed   Patient Education MD Decision to leave incomplete               Problems      No Problems Documented              Smoking Status      No Smoking Status Documented         PATIENT EDUCATION INFORMATION  Instructions:          Follow up:                   With: Address: When:   Follow up with primary care provider  Within 1 to 2 weeks   Comments:       Return to ED if symptoms worsen. Call [phone] to establish a Primary Care Doctor. You may also use the online tool at [employer]       With: Address: When:   [facility]  Within 2 to 4 weeks              ED PROVIDER DOCUMENTATION     Patient:   Rodney Stephens, Rodney Stephens             MRN: [id number]            FIN: [id number]               Age:   30 years     Sex:  Male     DOB:  Aug 09, 1990   Associated Diagnoses:   Abdominal discomfort   Author:   REUBEN CHARLESTON CLIVE-MD      Basic Information   Time  seen: Provider Seen (ST)   ED Provider/Time:    GREAVES,  ROBERT CLIVE-MD / 06/28/2020 12:28  .   Additional information: Chief Complaint from Nursing Triage Note   Chief Complaint   No qualifying data available.SABRA      History of Present Illness   The patient presents with Patient describes symptoms in [place].  We did obtain translator for further clarification.  Reports has had several weeks of upper abdominal pain.  Worse with eating.  Associated with headache to the back of his head and generalized weakness.  No vomiting or diarrhea  He was seen by [facility]enter[facility] several weeks ago and underwent an EGD and colonoscopy which was essentially normal other than mild esophageal reflux.  He was started on omeprazole.  Patient arrived via EMS stating that omeprazole is not helping and he continues to have symptoms..        Review of Systems   Constitutional symptoms:  No fever,    Skin symptoms:  No jaundice, no rash, no petechiae.    Eye symptoms:  Negative except as documented in HPI.   ENMT symptoms:  Negative except as documented in HPI.   Respiratory symptoms:  No shortness of breath, no orthopnea, no cough, no hemoptysis, no sputum production, no stridor, no wheezing.    Cardiovascular symptoms:  Negative except as documented in HPI, no chest pain, no palpitations, no tachycardia, no syncope, no diaphoresis, no peripheral edema.    Gastrointestinal symptoms:  Abdominal pain, no nausea, no vomiting, no diarrhea, no constipation, no rectal bleeding, no rectal pain.    Genitourinary symptoms:  Negative except as documented  in HPI.   Musculoskeletal symptoms:  Negative except as documented in HPI.   Neurologic symptoms:  No altered level of consciousness, no focal weakness.    Psychiatric symptoms:  Negative except as documented in HPI.   Endocrine symptoms:  Negative except as documented in HPI.   Hematologic/Lymphatic symptoms:  Negative except as documented in HPI.             Additional review of  systems information: All other systems reviewed and otherwise negative.      Health Status   Allergies:    Allergic Reactions (Selected)  No Known Medication Allergies.   Medications:  (Selected)   Inpatient Medications  Ordered  Isovue-300: 100 mL, IV Contrast, Once  Sodium Chloride 0.9% bolus: 1,000 mL, 1000 mL/hr, IV Piggyback, Once.      Past Medical/ Family/ Social History   Problem list:    No qualifying data available  .      Physical Examination               Vital Signs               Per nurse's notes.   Oxygen saturation.   General:  Alert, no acute distress, Not ill-appearing,    Skin:  Warm, dry, pink.    Head:  Normocephalic.   Neck:  Supple.   Eye:  Pupils are equal, round and reactive to light.   Cardiovascular:  Regular rate and rhythm, No murmur, Normal peripheral perfusion, No edema.    Respiratory:  Lungs are clear to auscultation, respirations are non-labored, breath sounds are equal, Symmetrical chest wall expansion.    Chest wall:  No tenderness.   Back:  Nontender, Normal range of motion.    Musculoskeletal:  Normal ROM.   Gastrointestinal:  Soft, Nontender, Non distended, Guarding: Negative, Rebound: Negative.    Neurological:  Alert and oriented to person, place, time, and situation, No focal neurological deficit observed, CN II-XII intact, normal motor observed, normal speech observed, normal coordination observed.    Psychiatric:  Cooperative, appropriate mood & affect.       Medical Decision Making   Documents reviewed:  Emergency department nurses' notes.   Results review:  Lab results : Lab View   06/28/2020 13:38 EDT Estimated Creatinine Clearance 76.78 mL/min   06/28/2020 12:46 EDT WBC 3.3 x10e3/mcL  LOW    RBC 5.50 x10e6/mcL    Hgb 15.6 g/dL    HCT 54.9 %    MCV 18.1 fL  LOW    MCH 28.4 pg    MCHC 34.7 g/dL    RDW 87.5 %    Platelet 239 x10e3/mcL    MPV 10.3 fL    Neutro Auto 53.5 %    Neutro Absolute 1.8 x10e3/mcL    Immature Grans Percent 0.3 %    Immature Grans Absolute 0.01  x10e3/mcL    Lymph Auto 35.8 %    Lymph Absolute 1.2 x10e3/mcL    Mono Auto 5.2 %    Mono Absolute 0.2 x10e3/mcL  LOW    Eosinophil Percent 4.3 %    Eos Absolute 0.1 x10e3/mcL    Basophil Auto 0.9 %    Baso Absolute 0.0 x10e3/mcL    NRBC Absolute Auto 0.000 x10e3/mcL    NRBC Percent Auto 0.0 %    Sodium Lvl 136 mmol/L    Potassium Lvl 4.6 mmol/L    Chloride 100 mmol/L    CO2 27 mmol/L    Glucose Random 114 mg/dL  HI    BUN 18 mg/dL    Creatinine Lvl 0.7 mg/dL    AGAP 9 mmol/L    Osmolality Calc 274 mOsm/kg    Calcium Lvl 9.4 mg/dL    Protein Total 7.0 g/dL    Albumin Lvl 4.7 g/dL    Globulin Calc 2.3 g/dL    AG Ratio Calc 7.93    Alk Phos 56 unit/L    AST 32 unit/L    ALT 40 unit/L    eGFR 130 mL/min/1.70m    Bili Total 0.71 mg/dL    Lipase Lvl 41 unit/L    Lactic Acid Lvl 0.8 mmol/L   .   Radiology results:  Rad Results (ST)   CT Abdomen Pelvis w/ Contrast  ?  06/28/20 14:32:41  CT abdomen and pelvis with contrast: 06/28/20    INDICATION:Abdominal pain, acute, nonlocalized.    COMPARISON: None    TECHNIQUE: Routine protocol (Axial portal venous phase imaging from the lung  bases to the pubic symphysis following IV and oral contrast with coronal  reconstruction.) CT scanning was performed using radiation dose reduction  techniques when appropriate, per system protocols.    FINDINGS:  Lower Thorax: Unremarkable.    Liver: Unremarkable.    Gallbladder/Biliary Tree: Unremarkable.    Spleen: Unremarkable.    Pancreas: Unremarkable.    Adrenal Glands: Unremarkable.    Kidneys/Ureters: Unremarkable.    Gastrointestinal: No bowel obstruction. Unremarkable appendix. Moderate fecal  loading. A few mildly prominent and fluid-filled small bowel.    Bladder: Unremarkable.    Prostate/Seminal Vesicles: Unremarkable.    Lymph Nodes: Unremarkable.    Vessels: No significant aortoiliac atherosclerotic calcification.    Peritoneum/Retroperitoneum: No drainable fluid collection.    Bones/Soft Tissues: No acute osseous  abnormality.    IMPRESSION:    No bowel obstruction. Normal appendix. A few loops of mildly prominent and  fluid-filled small bowel, potentially reflecting an infectious/inflammatory  enteritis in the appropriate clinical context.  ?  Signed By: ERICH HERITAGE S-MD  .   Notes:  Work-up nonspecific.  Discussed with patient labs and imaging.  His English is fair.  We used translation services.  I also spoke with family member via cell phone.  Plan is to try him on some different medicine.  He does have an appointment with [facility] but not until September 8.      Impression and Plan   Diagnosis   Abdominal discomfort (ICD10-CM R10.9, Discharge, Medical)   Plan   Condition: Stable.    Disposition: Discharged: Time  06/28/2020 15:22:00, to home.    Prescriptions: Launch prescriptions   Pharmacy:  Levsin 0.125 mg oral tablet (Prescribe): 0.125 mg, 1 tabs, Oral, QID, for 3 days, PRN: for spasm, 12 tabs, 0 Refill(s)  MiraLax oral powder for reconstitution (Prescribe): 17 g, Oral, Daily, for 14 days, 238 g, 0 Refill(s).    Follow up with: [facility] Within 2 to 4 weeks; Follow up with primary care provider Within 1 to 2 weeks     Return to ED if symptoms worsen.  Call [phone] to establish a Primary Care Doctor.  You may also use the online tool at [email or web address].    Counseled: Patient, Regarding diagnosis, Regarding diagnostic results, Regarding treatment plan, Regarding prescription, Patient indicated understanding of instructions.

## 2020-08-05 ENCOUNTER — Other Ambulatory Visit: Payer: Self-pay

## 2020-08-05 ENCOUNTER — Emergency Department (HOSPITAL_COMMUNITY)
Admission: EM | Admit: 2020-08-05 | Discharge: 2020-08-06 | Disposition: A | Discharge status: Home / Self Care | Payer: Medicaid - Out of State | Attending: Emergency Medicine | Admitting: Emergency Medicine

## 2020-08-05 DIAGNOSIS — R109 Unspecified abdominal pain: Secondary | ICD-10-CM | POA: Insufficient documentation

## 2020-08-05 DIAGNOSIS — Z20822 Contact with and (suspected) exposure to covid-19: Secondary | ICD-10-CM | POA: Diagnosis not present

## 2020-08-05 DIAGNOSIS — Z5321 Procedure and treatment not carried out due to patient leaving prior to being seen by health care provider: Secondary | ICD-10-CM | POA: Diagnosis not present

## 2020-08-05 NOTE — ED Triage Notes (Signed)
Pt states he has issues with his stomach and digestion.  Has paperwork with him from GI

## 2020-08-05 NOTE — ED Provider Notes (Signed)
Emergency Medicine Provider Triage Evaluation Note  Eric Mcbride , a 30 y.o. male  was evaluated in triage.  Pt complains of stomach pain. Reports associated weight loss.  States the pain worsens when he eats.  Seen recently by GI and has GI follow-up in a month. Taking omeprazole.  Reports associated headache.  Denies fevers or chills..  Review of Systems  Positive: Abdominal pain, vomiting Negative: Fever, chills  Physical Exam  BP 122/86 (BP Location: Left Arm)   Pulse 61   Temp 98.1 F (36.7 C) (Oral)   Resp 16   SpO2 98%  Gen:   Awake, no distress   Resp:  Normal effort  MSK:   Moves extremities without difficulty  Other:    Medical Decision Making  Medically screening exam initiated at 11:38 PM.  Appropriate orders placed.  Eric Mcbride was informed that the remainder of the evaluation will be completed by another provider, this initial triage assessment does not replace that evaluation, and the importance of remaining in the ED until their evaluation is complete.  Abdominal pain, weight loss, headache   Montine Circle, PA-C 08/05/20 2340    Orpah Greek, MD 08/06/20 [phone]

## 2020-08-06 LAB — RESP PANEL BY RT-PCR (FLU A&B, COVID) ARPGX2
Influenza A by PCR: NEGATIVE
Influenza B by PCR: NEGATIVE
SARS Coronavirus 2 by RT PCR: NEGATIVE

## 2020-08-06 NOTE — ED Notes (Signed)
Pt called for vitals x2 with no response

## 2020-08-06 NOTE — ED Notes (Signed)
Pt called x for vitals recheck with no response

## 2020-08-06 NOTE — ED Notes (Signed)
Pt called x3 wth no response

## 2020-08-31 ENCOUNTER — Encounter (HOSPITAL_COMMUNITY): Payer: Self-pay | Admitting: Emergency Medicine

## 2020-08-31 ENCOUNTER — Ambulatory Visit (HOSPITAL_COMMUNITY)
Admission: EM | Admit: 2020-08-31 | Discharge: 2020-08-31 | Disposition: A | Discharge status: Home / Self Care | Payer: Medicaid - Out of State | Attending: Medical Oncology | Admitting: Medical Oncology

## 2020-08-31 ENCOUNTER — Encounter: Payer: Self-pay | Admitting: *Deleted

## 2020-08-31 DIAGNOSIS — R630 Anorexia: Secondary | ICD-10-CM

## 2020-08-31 DIAGNOSIS — R634 Abnormal weight loss: Secondary | ICD-10-CM | POA: Diagnosis not present

## 2020-08-31 DIAGNOSIS — R109 Unspecified abdominal pain: Secondary | ICD-10-CM | POA: Diagnosis not present

## 2020-08-31 HISTORY — DX: Other specified bacterial intestinal infections: A04.8

## 2020-08-31 MED ORDER — SUCRALFATE 1 G PO TABS: 1.0000 g | ORAL_TABLET | Freq: Three times a day (TID) | ORAL | 1 refills | Status: DC

## 2020-08-31 MED ORDER — ESOMEPRAZOLE MAGNESIUM 40 MG PO CPDR: 40.0000 mg | DELAYED_RELEASE_CAPSULE | Freq: Every day | ORAL | 0 refills | Status: DC

## 2020-08-31 NOTE — ED Provider Notes (Signed)
[facility]    CSN: [id number] Arrival date & time: 08/31/20  1631      History   Chief Complaint Chief Complaint  Patient presents with   Abdominal Pain    HPI Eric Mcbride is a 30 y.o. male.   HPI  Abdominal Pain: Patient presents with the assistance of a Pashto interpreter.  He states that last January he was diagnosed with H. pylori and treated for this.  He reports that in June his symptoms returned and so he was seen in [place] and had endoscopy performed.  Endoscopy showed irritation and GERD.  He was placed on omeprazole and high Cosamin without improvement in symptoms.  He states that he continues to have abdominal cramping at times, has decreased appetite, and keeps losing weight.  He denies any vomiting or nausea.  He states that his stools are regular but ranged from green to black, no frank blood in stool, no constipation.  Currently he is not taking anything for symptoms.  Past Medical History:  Diagnosis Date   H. pylori infection     There are no problems to display for this patient.   History reviewed. No pertinent surgical history.     Home Medications    Prior to Admission medications   Not on File    Family History History reviewed. No pertinent family history.  Social History Social History   Tobacco Use   Smoking status: Never   Smokeless tobacco: Never     Allergies   Patient has no known allergies.   Review of Systems Review of Systems  As stated above in HPI Physical Exam Triage Vital Signs ED Triage Vitals [08/31/20 1731]  Enc Vitals Group     BP 120/76     Pulse Rate (!) 56     Resp 18     Temp 98.8 F (37.1 C)     Temp Source Oral     SpO2 95 %     Weight      Height      Head Circumference      Peak Flow      Pain Score      Pain Loc      Pain Edu?      Excl. in [place]?    No data found.  Updated Vital Signs BP 120/76 (BP Location: Left Arm)   Pulse (!) 56   Temp 98.8 F (37.1 C)  (Oral)   Resp 18   SpO2 95%   Physical Exam Vitals and nursing note reviewed.  Constitutional:      General: He is not in acute distress.    Appearance: He is well-developed. He is not ill-appearing, toxic-appearing or diaphoretic.  HENT:     Head: Normocephalic and atraumatic.  Abdominal:     General: Bowel sounds are normal.     Palpations: Abdomen is soft. There is no hepatomegaly, splenomegaly or mass.     Tenderness: There is no abdominal tenderness. There is no guarding or rebound. Negative signs include Murphy's sign, McBurney's sign and obturator sign.     Hernia: No hernia is present.  Skin:    General: Skin is warm.     Coloration: Skin is not jaundiced or pale.  Neurological:     Mental Status: He is alert and oriented to person, place, and time.     UC Treatments / Results  Labs (all labs ordered are listed, but only abnormal results are displayed) Labs Reviewed - No  data to display  EKG   Radiology No results found.  Procedures Procedures (including critical care time)  Medications Ordered in UC Medications - No data to display  Initial Impression / Assessment and Plan / UC Course  I have reviewed the triage vital signs and the nursing notes.  Pertinent labs & imaging results that were available during my care of the patient were reviewed by me and considered in my medical decision making (see chart for details).     New.  I discussed with patient that I would recommend that he start on sucralfate and Protonix.  He definitely needs to be seen by GI especially given his black stools.  He likely needs an endoscopy and colonoscopy.  I discussed that this would be provided through a GI specialist.  He asked about H. pylori blood testing and I explained that since he already had a positive result he will always remain positive for that particular testing so repeat testing is not recommended nor available at our office at this time.  Discussed red flag signs and  symptoms. Final Clinical Impressions(s) / UC Diagnoses   Final diagnoses:  None   Discharge Instructions   None    ED Prescriptions   None    PDMP not reviewed this encounter.   Hughie Closs, [place] 08/31/20 1810

## 2020-08-31 NOTE — ED Triage Notes (Signed)
Patient began having problems with h. Pylori 8 months ago, was diagnosed and treated for this. The pain improved. One month prior began having similar abdominal pain. One week ago it began to worsen significantly. C/o abdominal pain that's worse with eating, denies fever, nausea, vomiting. Requesting labs and GI work up.

## 2020-09-06 NOTE — [facility]regational Nurse Program (Signed)
  Dept: [facility] Nurse Program Note  Date of Encounter: 08/31/2020  Past Medical History: Past Medical History:  Diagnosis Date   H. pylori infection     Encounter Details:  CNP Questionnaire - 08/31/20 1600      Questionnaire   Do you give verbal consent to treat you today? Yes    Visit Setting Home    Location Patient Served At [facility]    Patient Status Refugee    Medical Provider No    Insurance Medicaid    Intervention Assess (including screenings)    Transportation Need transportation assistance    Referrals Urgent Care    ED Visit Averted Yes            Met with client in his apartment home per request.  Client tells me that he has been very sick.  He is having abdominal pain and cannot digest his food and has lost weight.  He could not work today because he feels bad.  I accompanied client to [facility], stayed with client and then accompanied client to the pharmacy and then back home.  Educated client regarding his post visit instructions from urgent care.  New prescription was not ready and not called into pharmacy when we went to pick it up.  Plan made to pick up and deliver new prescription for the following day.  Will follow up with client as needed.  Karene Fry, RN, MSN, [place] Office [phone] Cell

## 2020-09-13 ENCOUNTER — Encounter: Attending: Internal Medicine | Primary: Internal Medicine

## 2020-09-20 NOTE — Progress Notes (Signed)
This encounter was created in error - please disregard.

## 2020-10-01 ENCOUNTER — Ambulatory Visit: Payer: Medicaid - Out of State | Admitting: Physician Assistant

## 2020-10-01 ENCOUNTER — Other Ambulatory Visit: Payer: Self-pay

## 2020-10-01 VITALS — BP 117/94 | HR 76 | Temp 98.2°F | Resp 18 | Ht 67.0 in | Wt 122.0 lb

## 2020-10-01 DIAGNOSIS — K219 Gastro-esophageal reflux disease without esophagitis: Secondary | ICD-10-CM | POA: Diagnosis not present

## 2020-10-01 DIAGNOSIS — R634 Abnormal weight loss: Secondary | ICD-10-CM

## 2020-10-01 DIAGNOSIS — Z8619 Personal history of other infectious and parasitic diseases: Secondary | ICD-10-CM

## 2020-10-01 DIAGNOSIS — Z13228 Encounter for screening for other metabolic disorders: Secondary | ICD-10-CM

## 2020-10-01 DIAGNOSIS — J4520 Mild intermittent asthma, uncomplicated: Secondary | ICD-10-CM

## 2020-10-01 DIAGNOSIS — Z114 Encounter for screening for human immunodeficiency virus [HIV]: Secondary | ICD-10-CM

## 2020-10-01 DIAGNOSIS — Z1159 Encounter for screening for other viral diseases: Secondary | ICD-10-CM

## 2020-10-01 DIAGNOSIS — K921 Melena: Secondary | ICD-10-CM | POA: Diagnosis not present

## 2020-10-01 DIAGNOSIS — Z681 Body mass index (BMI) 19 or less, adult: Secondary | ICD-10-CM

## 2020-10-01 MED ORDER — SUCRALFATE 1 G PO TABS
1.0000 g | ORAL_TABLET | Freq: Three times a day (TID) | ORAL | 1 refills | Status: DC
  Filled 2020-10-01: qty 90, 23d supply, fill #0

## 2020-10-01 MED ORDER — FLUTICASONE-SALMETEROL 250-50 MCG/ACT IN AEPB
1.0000 | INHALATION_SPRAY | Freq: Two times a day (BID) | RESPIRATORY_TRACT | 6 refills | Status: DC
  Filled 2020-10-01: qty 60, 30d supply, fill #0

## 2020-10-01 MED ORDER — PANTOPRAZOLE SODIUM 40 MG PO TBEC
40.0000 mg | DELAYED_RELEASE_TABLET | Freq: Every day | ORAL | 3 refills | Status: DC
  Filled 2020-10-01: qty 30, 30d supply, fill #0

## 2020-10-01 MED ORDER — ALBUTEROL SULFATE HFA 108 (90 BASE) MCG/ACT IN AERS
2.0000 | INHALATION_SPRAY | Freq: Four times a day (QID) | RESPIRATORY_TRACT | 0 refills | Status: DC | PRN
  Filled 2020-10-01: qty 8.5, 25d supply, fill #0

## 2020-10-01 MED ORDER — MONTELUKAST SODIUM 10 MG PO TABS
10.0000 mg | ORAL_TABLET | Freq: Every day | ORAL | 3 refills | Status: DC
  Filled 2020-10-01: qty 30, 30d supply, fill #0

## 2020-10-01 NOTE — Patient Instructions (Signed)
We will call you with today's lab results.  You will start taking Protonix once daily instead of the Nexium, you will continue the Carafate.  I sent a refill for your Advair, and albuterol inhaler, and I would like you to start taking Zyrtec every morning, and Singulair at bedtime.  I did send prescriptions of those to your pharmacy.  Kennieth Rad, PA-C Physician Assistant [facility] Medicine [email or web address]

## 2020-10-01 NOTE — Progress Notes (Signed)
New Patient Office Visit  Subjective:  Patient ID: Eric Mcbride, male    DOB: 01-05-1991  Age: 30 y.o. MRN: [id number]  CC:  Chief Complaint  Patient presents with   Gastroesophageal Reflux     HPI Darnell Custer reports that he was diagnosed and treated for h pylori in January and continues to have bloating after each meal , states that his appetite is low, has had weight loss of approx 35 pounds since January,  States that he has been taking nexium and carafate for the past month without any change in symptoms.  Also endorses that he will occasionally have a black stool, denies blood in toilet , in stool or on tissue.  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  States that he was using Advair with relief.  Has been out of the medication for the past 2-3 months and has been experiencing some wheezing and SOB. Does endorse that he uses an albuterol inhaler with some relief, but states it is almost empty.   Due to language barrier, an interpreter was present during the history-taking and subsequent discussion (and for part of the physical exam) with this patient.    Past Medical History:  Diagnosis Date   H. pylori infection     History reviewed. No pertinent surgical history.  History reviewed. No pertinent family history.  Social History   Socioeconomic History   Marital status: Married    Spouse name: Not on file   Number of children: Not on file   Years of education: Not on file   Highest education level: Not on file  Occupational History   Not on file  Tobacco Use   Smoking status: Never   Smokeless tobacco: Never  Substance and Sexual Activity   Alcohol use: Not on file   Drug use: Not on file   Sexual activity: Not on file  Other Topics Concern   Not on file  Social History Narrative   Not on  file   Social Determinants of Health   Financial Resource Strain: Not on file  Food Insecurity: Not on file  Transportation Needs: Not on file  Physical Activity: Not on file  Stress: Not on file  Social Connections: Not on file  Intimate Partner Violence: Not on file    ROS Review of Systems  Constitutional:  Negative for chills and fever.  HENT:  Negative for congestion, sinus pressure and sore throat.   Eyes: Negative.   Respiratory:  Positive for shortness of breath and wheezing. Negative for cough.   Cardiovascular:  Negative for chest pain.  Gastrointestinal:  Negative for abdominal pain, blood in stool, constipation, diarrhea, nausea and vomiting.  Endocrine: Negative.   Genitourinary: Negative.   Musculoskeletal: Negative.   Skin: Negative.   Allergic/Immunologic: Negative.   Neurological: Negative.   Hematological: Negative.   Psychiatric/Behavioral: Negative.     Objective:   Today's Vitals: BP (!) 117/94 (BP Location: Left Arm, Patient Position: Sitting, Cuff Size: Normal)   Pulse 76   Temp 98.2 F (36.8 C) (Oral)   Resp 18   Ht 5\' 7"  (1.702 m)   Wt 122 lb (55.3 kg)   SpO2 99%   BMI 19.11 kg/m   Physical Exam Vitals and nursing note reviewed.  Constitutional:      Appearance: Normal appearance.  HENT:     Head: Normocephalic and atraumatic.     Right Ear: External ear normal.     Left Ear: External ear normal.     Nose: Nose normal.     Mouth/Throat:     Mouth: Mucous membranes are moist.     Pharynx: Oropharynx is clear.  Eyes:     Extraocular Movements: Extraocular movements intact.     Conjunctiva/sclera: Conjunctivae normal.     Pupils: Pupils are equal, round, and reactive to light.  Cardiovascular:     Rate and Rhythm: Normal rate and regular rhythm.     Pulses: Normal pulses.     Heart sounds: Normal heart sounds.  Pulmonary:     Effort: Pulmonary effort is normal.     Breath sounds: Examination of the right-upper field reveals  wheezing. Examination of the left-upper field reveals wheezing. Examination of the right-middle field reveals wheezing. Examination of the left-middle field reveals wheezing. Examination of the right-lower field reveals wheezing. Examination of the left-lower field reveals wheezing. Wheezing present.     Comments: Slight wheezing noted  Abdominal:     General: Abdomen is flat. Bowel sounds are normal. There is no distension.     Palpations: Abdomen is soft.     Tenderness: There is no abdominal tenderness.  Musculoskeletal:        General: Normal range of motion.     Cervical back: Normal range of motion and neck supple.  Skin:    General: Skin is warm and dry.  Neurological:     General: No focal deficit present.     Mental Status: He is alert and oriented to person, place, and time.  Psychiatric:        Mood and Affect: Mood normal.        Behavior: Behavior normal.        Thought Content: Thought content normal.        Judgment: Judgment normal.    Assessment & Plan:   Problem List Items Addressed This Visit       Digestive   Gastroesophageal reflux disease without esophagitis - Primary   Relevant Medications   pantoprazole (PROTONIX) 40 MG tablet   sucralfate (CARAFATE) 1 g tablet   Other Relevant Orders   Ambulatory referral to Gastroenterology     Other   Black stool   Relevant Orders   Ambulatory referral to Gastroenterology   CBC with Differential/Platelet   History of Helicobacter pylori infection   Relevant Orders   Ambulatory referral to Gastroenterology   Other Visit Diagnoses     Mild intermittent asthma without complication       Relevant Medications   albuterol (VENTOLIN HFA) 108 (90 Base) MCG/ACT inhaler   fluticasone-salmeterol (ADVAIR) 250-50 MCG/ACT AEPB   montelukast (SINGULAIR) 10 MG tablet   Loss of weight       Screening for metabolic disorder       Relevant Orders   Comp. Metabolic Panel (12)   TSH   Screening for HIV (human  immunodeficiency virus)       Relevant Orders   HIV antibody (with reflex)   Encounter for HCV screening test for low risk patient       Relevant Orders   HCV Ab w Reflex to Quant PCR   BMI less than 19,adult           Outpatient Encounter Medications as of 10/01/2020  Medication Sig   albuterol (VENTOLIN HFA) 108 (90 Base) MCG/ACT inhaler Inhale 2  puffs into the lungs every 6 (six) hours as needed for wheezing or shortness of breath.   fluticasone-salmeterol (ADVAIR) 250-50 MCG/ACT AEPB Inhale 1 puff into the lungs 2 (two) times daily.   montelukast (SINGULAIR) 10 MG tablet Take 1 tablet (10 mg total) by mouth at bedtime.   pantoprazole (PROTONIX) 40 MG tablet Take 1 tablet (40 mg total) by mouth daily.   [DISCONTINUED] esomeprazole (NEXIUM) 40 MG capsule Take 1 capsule (40 mg total) by mouth daily.   [DISCONTINUED] sucralfate (CARAFATE) 1 g tablet Take 1 tablet (1 g total) by mouth 4 (four) times daily -  with meals and at bedtime.   sucralfate (CARAFATE) 1 g tablet Take 1 tablet (1 g total) by mouth 4 (four) times daily -  with meals and at bedtime.   No facility-administered encounter medications on file as of 10/01/2020.   1. Gastroesophageal reflux disease without esophagitis Trial Protonix, cont carafate.  Continue lifestyle medications.  Patient was given application for [facility] health financial assistance, strongly encouraged to gather documents as soon as possible so he can be seen by gastroenterology.  Does have appointment upcoming on October 14 to establish care Red flags for prompt reevaluation - Ambulatory referral to Gastroenterology - pantoprazole (PROTONIX) 40 MG tablet; Take 1 tablet (40 mg total) by mouth daily.  Dispense: 30 tablet; Refill: 3 - sucralfate (CARAFATE) 1 g tablet; Take 1 tablet (1 g total) by mouth 4 (four) times daily -  with meals and at bedtime.  Dispense: 90 tablet; Refill: 1  2. Black stool  - Ambulatory referral to Gastroenterology - CBC with  Differential/Platelet  3. History of Helicobacter pylori infection  - Ambulatory referral to Gastroenterology  4. Mild intermittent asthma without complication Resume Advair, albuterol, trial zyrtec and singulair.  - albuterol (VENTOLIN HFA) 108 (90 Base) MCG/ACT inhaler; Inhale 2 puffs into the lungs every 6 (six) hours as needed for wheezing or shortness of breath.  Dispense: 8.5 g; Refill: 0 - fluticasone-salmeterol (ADVAIR) 250-50 MCG/ACT AEPB; Inhale 1 puff into the lungs 2 (two) times daily.  Dispense: 60 each; Refill: 6 - montelukast (SINGULAIR) 10 MG tablet; Take 1 tablet (10 mg total) by mouth at bedtime.  Dispense: 30 tablet; Refill: 3  5. Loss of weight   6. Screening for metabolic disorder  - Comp. Metabolic Panel (12) - TSH  7. Screening for HIV (human immunodeficiency virus)  - HIV antibody (with reflex)  8. Encounter for HCV screening test for low risk patient  - HCV Ab w Reflex to Quant PCR  9. BMI less than 19,adult    I have reviewed the patient's medical history (PMH, PSH, Social History, Family History, Medications, and allergies) , and have been updated if relevant. I spent 35 minutes reviewing chart and  face to face time with patient.    Follow-up: No follow-ups on file.   Loraine Grip Mayers, PA-C

## 2020-10-01 NOTE — Progress Notes (Signed)
Patient has not eaten and patient has not taken medication today. Patient denies pain at this time. Patient was Dx with H Pylori 6 months ago while in a "camp". Patient reports Sx of H Pylori subsiding after treatment but now reports bloating after eating which causes mental concerns. 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

## 2020-10-02 LAB — CBC WITH DIFFERENTIAL/PLATELET
Basophils Absolute: 0 10*3/uL (ref 0.0–0.2)
Basos: 1 %
EOS (ABSOLUTE): 0.5 10*3/uL — ABNORMAL HIGH (ref 0.0–0.4)
Eos: 13 %
Hematocrit: 51.3 % — ABNORMAL HIGH (ref 37.5–51.0)
Hemoglobin: 17.2 g/dL (ref 13.0–17.7)
Immature Grans (Abs): 0 10*3/uL (ref 0.0–0.1)
Immature Granulocytes: 0 %
Lymphocytes Absolute: 1.3 10*3/uL (ref 0.7–3.1)
Lymphs: 30 %
MCH: 27.4 pg (ref 26.6–33.0)
MCHC: 33.5 g/dL (ref 31.5–35.7)
MCV: 82 fL (ref 79–97)
Monocytes Absolute: 0.2 10*3/uL (ref 0.1–0.9)
Monocytes: 6 %
Neutrophils Absolute: 2.1 10*3/uL (ref 1.4–7.0)
Neutrophils: 50 %
Platelets: 238 10*3/uL (ref 150–450)
RBC: 6.28 x10E6/uL — ABNORMAL HIGH (ref 4.14–5.80)
RDW: 13 % (ref 11.6–15.4)
WBC: 4.2 10*3/uL (ref 3.4–10.8)

## 2020-10-02 LAB — COMP. METABOLIC PANEL (12)
AST: 30 IU/L (ref 0–40)
Albumin/Globulin Ratio: 2.3 — ABNORMAL HIGH (ref 1.2–2.2)
Albumin: 4.9 g/dL (ref 4.1–5.2)
Alkaline Phosphatase: 71 IU/L (ref 44–121)
BUN/Creatinine Ratio: 17 (ref 9–20)
BUN: 14 mg/dL (ref 6–20)
Bilirubin Total: 0.6 mg/dL (ref 0.0–1.2)
Calcium: 9.5 mg/dL (ref 8.7–10.2)
Chloride: 104 mmol/L (ref 96–106)
Creatinine, Ser: 0.81 mg/dL (ref 0.76–1.27)
Globulin, Total: 2.1 g/dL (ref 1.5–4.5)
Glucose: 92 mg/dL (ref 70–99)
Potassium: 4.1 mmol/L (ref 3.5–5.2)
Sodium: 140 mmol/L (ref 134–144)
Total Protein: 7 g/dL (ref 6.0–8.5)
eGFR: 123 mL/min/{1.73_m2} (ref >59)

## 2020-10-02 LAB — HCV INTERPRETATION

## 2020-10-02 LAB — HCV AB W REFLEX TO QUANT PCR: HCV Ab: <0.1 s/co ratio (ref 0.0–0.9)

## 2020-10-02 LAB — HIV ANTIBODY (ROUTINE TESTING W REFLEX): HIV Screen 4th Generation wRfx: NONREACTIVE

## 2020-10-02 LAB — TSH: TSH: 1.13 u[IU]/mL (ref 0.450–4.500)

## 2020-10-08 ENCOUNTER — Telehealth: Payer: Self-pay | Admitting: *Deleted

## 2020-10-08 NOTE — Telephone Encounter (Signed)
Medical Assistant used [employer] to contact patient.  Interpreter Name: Myra Rude Interpreter #: [id number] UTR patient after 2 attempts with no VM option.

## 2020-10-08 NOTE — Telephone Encounter (Signed)
-----   Message from Kennieth Rad, [place] sent at 10/02/2020  5:44 PM EDT ----- Please call patient and let him know that his thyroid function, kidney and liver function are within normal limits.  He does not show signs of anemia.  His screening for hepatitis C and HIV were negative.

## 2020-10-18 ENCOUNTER — Telehealth: Payer: Self-pay

## 2020-10-18 NOTE — Telephone Encounter (Signed)
Patient requested transportation assistance. Ride scheduled with [employer]. Pick up time 0830 am.  Honor Loh RN [address] [place] [phone]

## 2020-10-19 ENCOUNTER — Encounter: Payer: Self-pay | Admitting: Nurse Practitioner

## 2020-10-19 ENCOUNTER — Other Ambulatory Visit: Payer: Self-pay

## 2020-10-19 ENCOUNTER — Ambulatory Visit (INDEPENDENT_AMBULATORY_CARE_PROVIDER_SITE_OTHER): Payer: Medicaid - Out of State | Admitting: Nurse Practitioner

## 2020-10-19 VITALS — BP 123/87 | HR 60 | Temp 97.5°F | Ht 67.0 in | Wt 122.5 lb

## 2020-10-19 DIAGNOSIS — Z7689 Persons encountering health services in other specified circumstances: Secondary | ICD-10-CM

## 2020-10-19 DIAGNOSIS — K219 Gastro-esophageal reflux disease without esophagitis: Secondary | ICD-10-CM | POA: Diagnosis not present

## 2020-10-19 DIAGNOSIS — R1084 Generalized abdominal pain: Secondary | ICD-10-CM

## 2020-10-19 DIAGNOSIS — Z23 Encounter for immunization: Secondary | ICD-10-CM

## 2020-10-19 DIAGNOSIS — R5383 Other fatigue: Secondary | ICD-10-CM

## 2020-10-19 DIAGNOSIS — R29898 Other symptoms and signs involving the musculoskeletal system: Secondary | ICD-10-CM

## 2020-10-19 DIAGNOSIS — G8929 Other chronic pain: Secondary | ICD-10-CM

## 2020-10-19 DIAGNOSIS — Z8619 Personal history of other infectious and parasitic diseases: Secondary | ICD-10-CM

## 2020-10-19 LAB — POCT URINALYSIS DIP (CLINITEK)
Bilirubin, UA: NEGATIVE
Blood, UA: NEGATIVE
Glucose, UA: NEGATIVE mg/dL
Ketones, POC UA: NEGATIVE mg/dL
Leukocytes, UA: NEGATIVE
Nitrite, UA: NEGATIVE
POC PROTEIN,UA: NEGATIVE
Spec Grav, UA: 1.015 (ref 1.010–1.025)
Urobilinogen, UA: 0.2 E.U./dL
pH, UA: 7 (ref 5.0–8.0)

## 2020-10-19 NOTE — Patient Instructions (Addendum)
Health Maintenance, Male Adopting a healthy lifestyle and getting preventive care are important in promoting health and wellness. Ask your health care provider about: The right schedule for you to have regular tests and exams. Things you can do on your own to prevent diseases and keep yourself healthy. What should I know about diet, weight, and exercise? Eat a healthy diet  Eat a diet that includes plenty of vegetables, fruits, low-fat dairy products, and lean protein. Do not eat a lot of foods that are high in solid fats, added sugars, or sodium. Maintain a healthy weight Body mass index (BMI) is a measurement that can be used to identify possible weight problems. It estimates body fat based on height and weight. Your health care provider can help determine your BMI and help you achieve or maintain a healthy weight. Get regular exercise Get regular exercise. This is one of the most important things you can do for your health. Most adults should: Exercise for at least 150 minutes each week. The exercise should increase your heart rate and make you sweat (moderate-intensity exercise). Do strengthening exercises at least twice a week. This is in addition to the moderate-intensity exercise. Spend less time sitting. Even light physical activity can be beneficial. Watch cholesterol and blood lipids Have your blood tested for lipids and cholesterol at 28 years of age, then have this test every 5 years. You may need to have your cholesterol levels checked more often if: Your lipid or cholesterol levels are high. You are older than 28 years of age. You are at high risk for heart disease. What should I know about cancer screening? Many types of cancers can be detected early and may often be prevented. Depending on your health history and family history, you may need to have cancer screening at various ages. This may include screening for: Colorectal cancer. Prostate cancer. Skin cancer. Lung  cancer. What should I know about heart disease, diabetes, and high blood pressure? Blood pressure and heart disease High blood pressure causes heart disease and increases the risk of stroke. This is more likely to develop in people who have high blood pressure readings, are of African descent, or are overweight. Talk with your health care provider about your target blood pressure readings. Have your blood pressure checked: Every 3-5 years if you are 18-39 years of age. Every year if you are 40 years old or older. If you are between the ages of 65 and 75 and are a current or former smoker, ask your health care provider if you should have a one-time screening for abdominal aortic aneurysm (AAA). Diabetes Have regular diabetes screenings. This checks your fasting blood sugar level. Have the screening done: Once every three years after age 45 if you are at a normal weight and have a low risk for diabetes. More often and at a younger age if you are overweight or have a high risk for diabetes. What should I know about preventing infection? Hepatitis B If you have a higher risk for hepatitis B, you should be screened for this virus. Talk with your health care provider to find out if you are at risk for hepatitis B infection. Hepatitis C Blood testing is recommended for: Everyone born from 1945 through 1965. Anyone with known risk factors for hepatitis C. Sexually transmitted infections (STIs) You should be screened each year for STIs, including gonorrhea and chlamydia, if: You are sexually active and are younger than 28 years of age. You are older than 28 years   of age and your health care provider tells you that you are at risk for this type of infection. Your sexual activity has changed since you were last screened, and you are at increased risk for chlamydia or gonorrhea. Ask your health care provider if you are at risk. Ask your health care provider about whether you are at high risk for HIV.  Your health care provider may recommend a prescription medicine to help prevent HIV infection. If you choose to take medicine to prevent HIV, you should first get tested for HIV. You should then be tested every 3 months for as long as you are taking the medicine. Follow these instructions at home: Lifestyle Do not use any products that contain nicotine or tobacco, such as cigarettes, e-cigarettes, and chewing tobacco. If you need help quitting, ask your health care provider. Do not use street drugs. Do not share needles. Ask your health care provider for help if you need support or information about quitting drugs. Alcohol use Do not drink alcohol if your health care provider tells you not to drink. If you drink alcohol: Limit how much you have to 0-2 drinks a day. Be aware of how much alcohol is in your drink. In the [place] one drink equals one 12 oz bottle of beer (355 mL), one 5 oz glass of wine (148 mL), or one 1 oz glass of hard liquor (44 mL). General instructions Schedule regular health, dental, and eye exams. Stay current with your vaccines. Tell your health care provider if: You often feel depressed. You have ever been abused or do not feel safe at home. Summary Adopting a healthy lifestyle and getting preventive care are important in promoting health and wellness. Follow your health care provider's instructions about healthy diet, exercising, and getting tested or screened for diseases. Follow your health care provider's instructions on monitoring your cholesterol and blood pressure. This information is not intended to replace advice given to you by your health care provider. Make sure you discuss any questions you have with your health care provider. Document Revised: 03/02/2020 Document Reviewed: 12/16/2017 Elsevier Patient Education  2022 [place].  Abdominal Pain, Adult Many things can cause belly (abdominal) pain. Most times, belly pain is not dangerous. Many cases of  belly pain can be watched and treated at home. Sometimes, though, belly pain is serious. Your doctor will try to find the cause of your belly pain. Follow these instructions at home: Medicines Take over-the-counter and prescription medicines only as told by your doctor. Do not take medicines that help you poop (laxatives) unless told by your doctor. General instructions Watch your belly pain for any changes. Drink enough fluid to keep your pee (urine) pale yellow. Keep all follow-up visits as told by your doctor. This is important. Contact a doctor if: Your belly pain changes or gets worse. You are not hungry, or you lose weight without trying. You are having trouble pooping (constipated) or have watery poop (diarrhea) for more than 2-3 days. You have pain when you pee or poop. Your belly pain wakes you up at night. Your pain gets worse with meals, after eating, or with certain foods. You are vomiting and cannot keep anything down. You have a fever. You have blood in your pee. Get help right away if: Your pain does not go away as soon as your doctor says it should. You cannot stop vomiting. Your pain is only in areas of your belly, such as the right side or the left lower  part of the belly. You have bloody or black poop, or poop that looks like tar. You have very bad pain, cramping, or bloating in your belly. You have signs of not having enough fluid or water in your body (dehydration), such as: Dark pee, very little pee, or no pee. Cracked lips. Dry mouth. Sunken eyes. Sleepiness. Weakness. You have trouble breathing or chest pain. Summary Many cases of belly pain can be watched and treated at home. Watch your belly pain for any changes. Take over-the-counter and prescription medicines only as told by your doctor. Contact a doctor if your belly pain changes or gets worse. Get help right away if you have very bad pain, cramping, or bloating in your belly. This information is  not intended to replace advice given to you by your health care provider. Make sure you discuss any questions you have with your health care provider. Document Revised: 05/03/2018 Document Reviewed: 05/03/2018 Elsevier Patient Education  [place].

## 2020-10-19 NOTE — Progress Notes (Signed)
[place],   [address] Phone:  [phone]   Fax:  [phone]   New Patient Office Visit  Subjective:  Patient ID: Eric Mcbride, male    DOB: 03-25-1990  Age: 30 y.o. MRN: [id number]  CC:  Chief Complaint  Patient presents with   Establish Care    Pt is here to establish care. We will be using the language services to interpret the Pashto language for the patient. Pt states that he is having abdominal pains more when he is eating. Pt states that he feels bloated when he eats. Pt does have a Gastro doctor that he saw back in September for his abdominal pains. Pt states that he has stomach H. Pylori.        HPI Eric Mcbride presents to establish care. He  has a past medical history of Acid reflux disease and H. pylori infection.   He is from [place]; here for 15 mons. He is working fulltime at [employer].  Abdominal Pain Patient complains of abdominal pain. The pain is located in the entire abdomen. The pain is described as aching, and it varies in intensity. Onset was 2 years ago. Symptoms have been gradually worsening since. Aggravating factors include eating.  Alleviating factors include none. Associated symptoms include weight loss about 30 pounds in 7 mons. and constipation. Heaviness in stomach with all food.The patient denies arthralgias, chills, diarrhea, dysuria, fever, frequency, hematochezia, hematuria, melena, myalgias, nausea, sweats, and vomiting. He was tested 4 mo ago. He had labs and stool testing. He reports in [facility] he had endoscopy and he was diagnosed with GERD. He denies any anxiety or stress.   Past Medical History:  Diagnosis Date   Acid reflux disease    H. pylori infection     History reviewed. No pertinent surgical history.  Family History  Family history unknown: Yes    Social History   Socioeconomic History   Marital status: Married    Spouse name: Not on file   Number of  children: Not on file   Years of education: Not on file   Highest education level: Not on file  Occupational History   Not on file  Tobacco Use   Smoking status: Never   Smokeless tobacco: Never  Vaping Use   Vaping Use: Never used  Substance and Sexual Activity   Alcohol use: Not Currently   Drug use: Not Currently   Sexual activity: Yes    Birth control/protection: None  Other Topics Concern   Not on file  Social History Narrative   Not on file   Social Determinants of Health   Financial Resource Strain: Not on file  Food Insecurity: Not on file  Transportation Needs: Not on file  Physical Activity: Not on file  Stress: Not on file  Social Connections: Not on file  Intimate Partner Violence: Not on file    ROS Review of Systems  Constitutional:  Positive for appetite change (decreased) and unexpected weight change.  HENT:         Seasonal allergies   Eyes: Negative.   Respiratory:  Negative for shortness of breath.   Cardiovascular:  Negative for chest pain.  Musculoskeletal:  Positive for back pain and joint swelling.   Objective:   Today's Vitals: BP 123/87   Pulse 60   Temp (!) 97.5 F (36.4 C)   Ht 5\' 7"  (1.702 m)   Wt 122 lb 8 oz (55.6 kg)  SpO2 98%   BMI 19.19 kg/m   Physical Exam Constitutional:      General: He is not in acute distress.    Appearance: He is normal weight. He is not ill-appearing, toxic-appearing or diaphoretic.  HENT:     Head: Normocephalic and atraumatic.     Nose: Nose normal.     Mouth/Throat:     Mouth: Mucous membranes are moist.  Cardiovascular:     Rate and Rhythm: Normal rate and regular rhythm.     Pulses: Normal pulses.     Heart sounds: Normal heart sounds.  Pulmonary:     Effort: Pulmonary effort is normal.     Breath sounds: Normal breath sounds.  Abdominal:     General: Abdomen is flat. Bowel sounds are normal.     Palpations: Abdomen is soft.     Tenderness: There is no right CVA tenderness, left CVA  tenderness or guarding.  Musculoskeletal:        General: Normal range of motion.     Cervical back: Normal range of motion.  Skin:    General: Skin is warm and dry.     Capillary Refill: Capillary refill takes less than 2 seconds.  Neurological:     General: No focal deficit present.     Mental Status: He is alert and oriented to person, place, and time.  Psychiatric:        Mood and Affect: Mood normal.        Behavior: Behavior normal.        Thought Content: Thought content normal.        Judgment: Judgment normal.    Assessment & Plan:   Problem List Items Addressed This Visit       Digestive   Gastroesophageal reflux disease without esophagitis Persistent Using Nexium and Protonix   Relevant Orders   Ambulatory referral to Gastroenterology   CBC with Differential/Platelet (Completed)   Amylase (Completed)   Lipase (Completed)     Other   History of Helicobacter pylori infection   Relevant Orders   H. pylori breath test (Completed)   Other Visit Diagnoses     Encounter to establish care    -  Primary Discussed male health maintenance; self exams of breast and testicles  and annual exams. Discussed prostate age related concerns Discussed general safety in vehicle and COVID Discussed regular hydration with water Discussed healthy diet and exercise and weight management Discussed sexual health  Discussed mental health Encouraged to call our office for an appointment with in ongoing concerns for questions.      Relevant Orders   POCT URINALYSIS DIP (CLINITEK) (Completed)   Flu vaccine need       Chronic generalized abdominal pain     Persistent with weight loss Referral to gastroenterology for evaluation   Relevant Orders   Ambulatory referral to Gastroenterology   CBC with Differential/Platelet (Completed)   Amylase (Completed)   Lipase (Completed)   US Abdomen Complete   Fatigue, unspecified type     Persistent Due to unexplained weight loss    Relevant Orders   Sedimentation rate (Completed)   Magnesium (Completed)   Weakness of both legs     Persistent   Relevant Orders   Sedimentation rate (Completed)   Magnesium (Completed)       Outpatient Encounter Medications as of 10/19/2020  Medication Sig   albuterol (VENTOLIN HFA) 108 (90 Base) MCG/ACT inhaler Inhale 2 puffs into the lungs every 6 (six) hours as needed  for wheezing or shortness of breath.   fluticasone-salmeterol (ADVAIR) 250-50 MCG/ACT AEPB Inhale 1 puff into the lungs 2 (two) times daily.   montelukast (SINGULAIR) 10 MG tablet Take 1 tablet (10 mg total) by mouth at bedtime.   pantoprazole (PROTONIX) 40 MG tablet Take 1 tablet (40 mg total) by mouth daily.   sucralfate (CARAFATE) 1 g tablet Take 1 tablet (1 g total) by mouth 4 (four) times daily -  with meals and at bedtime.   [DISCONTINUED] esomeprazole (NEXIUM) 40 MG packet Take 40 mg by mouth daily.   No facility-administered encounter medications on file as of 10/19/2020.    Follow-up: Return in about 1 year (around 10/19/2021).   Vevelyn Francois, NP

## 2020-10-20 LAB — MAGNESIUM: Magnesium: 2 mg/dL (ref 1.6–2.3)

## 2020-10-20 LAB — LIPASE: Lipase: 51 U/L (ref 13–78)

## 2020-10-20 LAB — CBC WITH DIFFERENTIAL/PLATELET
Basophils Absolute: 0 10*3/uL (ref 0.0–0.2)
Basos: 1 %
EOS (ABSOLUTE): 0.2 10*3/uL (ref 0.0–0.4)
Eos: 5 %
Hematocrit: 45.7 % (ref 37.5–51.0)
Hemoglobin: 16 g/dL (ref 13.0–17.7)
Immature Grans (Abs): 0 10*3/uL (ref 0.0–0.1)
Immature Granulocytes: 0 %
Lymphocytes Absolute: 1.6 10*3/uL (ref 0.7–3.1)
Lymphs: 38 %
MCH: 28.3 pg (ref 26.6–33.0)
MCHC: 35 g/dL (ref 31.5–35.7)
MCV: 81 fL (ref 79–97)
Monocytes Absolute: 0.3 10*3/uL (ref 0.1–0.9)
Monocytes: 7 %
Neutrophils Absolute: 2.1 10*3/uL (ref 1.4–7.0)
Neutrophils: 49 %
Platelets: 240 10*3/uL (ref 150–450)
RBC: 5.65 x10E6/uL (ref 4.14–5.80)
RDW: 13 % (ref 11.6–15.4)
WBC: 4.2 10*3/uL (ref 3.4–10.8)

## 2020-10-20 LAB — AMYLASE: Amylase: 63 U/L (ref 31–110)

## 2020-10-20 LAB — SEDIMENTATION RATE: Sed Rate: 2 mm/hr (ref 0–15)

## 2020-10-22 LAB — H. PYLORI BREATH TEST: H pylori Breath Test: NEGATIVE

## 2020-10-23 ENCOUNTER — Encounter: Payer: Self-pay | Admitting: Nurse Practitioner

## 2020-10-23 ENCOUNTER — Telehealth: Payer: Self-pay

## 2020-10-23 NOTE — Telephone Encounter (Signed)
Attempted to contact patient regarding ultrasound appointment. Voicemail not set up.

## 2020-10-30 ENCOUNTER — Ambulatory Visit (HOSPITAL_COMMUNITY): Payer: BC Managed Care – PPO | Attending: Nurse Practitioner

## 2020-11-09 ENCOUNTER — Encounter: Payer: Self-pay | Admitting: Nurse Practitioner

## 2020-11-09 ENCOUNTER — Ambulatory Visit (INDEPENDENT_AMBULATORY_CARE_PROVIDER_SITE_OTHER): Payer: BC Managed Care – PPO | Admitting: Nurse Practitioner

## 2020-11-09 VITALS — BP 90/70 | HR 68 | Ht 66.25 in | Wt 125.5 lb

## 2020-11-09 DIAGNOSIS — R1010 Upper abdominal pain, unspecified: Secondary | ICD-10-CM

## 2020-11-09 DIAGNOSIS — R634 Abnormal weight loss: Secondary | ICD-10-CM | POA: Diagnosis not present

## 2020-11-09 DIAGNOSIS — K219 Gastro-esophageal reflux disease without esophagitis: Secondary | ICD-10-CM | POA: Diagnosis not present

## 2020-11-09 MED ORDER — OMEPRAZOLE 20 MG PO CPDR: 20.0000 mg | DELAYED_RELEASE_CAPSULE | Freq: Every day | ORAL | 3 refills | Status: DC

## 2020-11-09 NOTE — Patient Instructions (Addendum)
???????: ???? ? EGD ????? ???? ??? ????. ??????? ???? ??? ????? ???????? ????? ??? ?? ???? ?? ? ?? ???? ????? ?? ????? ???. ?? ???? ??? ????????? ????? (??? ????? ? ????? ?? ???? ??)? ??????? ???? ? ??? ???????? ?? ??? ?? ??? ??? ?????.  ????: ??? ????? ??????? ?? ????? ???? ????? ???? ???? ?? ????? ??? ?????: Omeprazole 20 MG ?? ???? ? ???? ?? ????? ?? ????? ???? ? ?????.  ? ???? ??????? ??????? ????  Gastroesophageal reflux (GER) ??? ??? ?????? ??? ?? ? ???? ??? ????? ???? ?? ?????? ??? ?? ???? ?? ???? (esophagus) ??? ?????. ?? ?????? ???? ????? ? esophagus ????? ??? ??? ?? ?? ???? ?? ???? ???? ???? ??? ??. ? GER ???? ????? ?? ? ???? ????? ??? ????? ????? ? ??????? ?? ??. ???? ???? ? Gastroesophageal reflux disease (GERD) ?? ??? ?????? ???? ?? ???????:  ???? ?????? ??????.  ? ???? ?? ???? ????? ??? ???? ????.  ? ?????? ???? ???? ??? ? ???????? ????. ??? ?? ?? ??? ??? ? ??????? ??? ?? ????? ????. ? ??? ?? ?????? ???? GERD ???? ?? ? esophagus ?? ???? ?? ????? ???? (????) ???????? ???. ??????? ?? ?? ??? ?? ???? ? esophagus ?? ???? ????? ? ?????? ? ?????? ?? ???? ??????? ????. ??? ?? ?? ???? ????? ?? ?? ?????? ?? ??? ?? ?? ??? ???? ?? ??? ???? ???? ????? ?? ????? ?? ???? ??? ????? ?????? ??. ????? ?????? ???? ?? ??? ??:  ? ?????? ???????.  ?????????.  ? ?? ??? ??? ????? ?????? (hiatal hernia).  ? ????? ???????.  ???? ????? ?? ??????? ??? ????? ??????? ????? ?? ???. ?? ?? ??? ???????  ? ??? ??? ??????.  ? ???? ?????? ?????? ?? ????? ? ??????? ??? ????? ???.  ? NSAIDs ??????? ??? ibuprofen. ??? ?? ????? ?? ???  ? ??? ???????.  ??????? ?? ?????? ??????.  ?? ????? ?? ? ???? ?????.  ?? ???? ?? ???? ????.  ?? ???.  ? ??? ???? ??????.  ? ???? ????? ?? ?????.  ???? ???.  ? ???? ???. ????? ????? ???? ?? ? ???? ??? ???? ??. ??? ?????? ??? ?? ??? ????? ????? ?? ???? ? ???? ??? ???.  ? ??? ???? ?? ????.  ? ??? ????????? ???? ?? ????.  ? ?????? ? ???? ??? ??? (? ?????? ??????).  ? ???  ????. ?? ???? ?????? ?????  ?? ???? ?? ????? ??????.  ???? ??????.  ????? ???. ?????? ?? ??? ???? ??? ???? ?? ????? ??? ????? ????? ??. ?? ??? ?? ?? ???????? ????? ???: ???? ?? ???   1. ??? ???? ?? ????? ? ????? ???? ??? ??? ????? ????? ???. ???? ????? ??? ? ????? ?? ??????? ??? ??? ???? ???: o ???? ?? ???? ? ????? ??? ?? ????. o ??? ??????? ?? ????? ???. o ????? ???? ?? ????? ????. o ???? (????????) ??????? ?? ????. o ?????? ?? ????. o ? ?????? ?? ?????? ??????. o ???? ?? ????. o ????????. o ????? ?????? ?? ????? ?????? ?????. ??? ?? ???? ? ??? ????? ??? ????? ????? ??? ???? ?? ? BBQ ??? ???? ??. o ? ?????? ???? ??? ?? ?????? ???? ??? ?????? ???? ?? ????. o ? ??????? ?? ???? ?????. ??? ?? ??? ???? ???? ?????? ?? ???? ? ??? ??? ??? ???? ??. o ??? ??? ?? ??? ??? ?????. ?? ?? ?? ????? ??????? ????? ? ????? ???? ?? ??? ??? ???? ????? ??. o ??? ??? ?????? ????. ??? ?? ??? ???? ? ??? ????? ????? ?????? ???? ?? ???? ???? ??.  o ? ??? ??? ??????? ????? ??? ??? ????? ????? ?? ???? ????. 2. ???? ?????? ????? ????? ?????. ? ???? ???? ????? ??? ??? ????. 3. ? ???? ????? ??? ? ??? ????? ??????? ???? ??? ??? ????. 4. ? ???? ???? ??? 2-3 ????? ???? ? ????? ????? ??? ??? ????. 5. ?? ????? ?????? ?????? ?? ???? ???? ??? ????. ?- ?? ????? ?????? ?? ????? ?? ???. ? ???? ???    ???? ?? ???? ?? ??? ??????? ?? ????? ?? ??????? ?? ?????? ????. ?? ???? ? ???? ????? ????? ?? ????? ???? ??? ????? ??? ?????? ????.  ??? ???? ??? ???? ?? ???. ?? ???? ? ?? ???? ????? ????? ?? ????? ???? ??? ????? ??? ?????? ????.  ?? ???? ??? ??? ???? ? ??? ?????? ?? ??? ?? ????? ????? ??? ??. ? ??? ????? ??? ? ????? ??? ????? ??? ?? ??? ?????? ????. ????? ????????  ????? ?? ??? ?? ?? ??? ????? ?? ??? ????.  ? ???? ?? ???? ???? ????? ??? ??? ????? ?? ????? ????? ???? ???? ??.  ??????? ibuprofen? ?? ??? NSAIDs ?? ???? ??? ?? ?? ????? ????? ???? ???? ?? ?? ??? ??.  ???? ???? ???????. ? ??? ??? ?????? ??? ?? ?? ??????.  ? ???  ???? ?? ?????? 6 ??? (15 ????? ????) ????? ???. ???? ???? ? ?? ???? ????? ? ??? ?????? ?? ????? ????.  ?? ???? ?? ????? ??? ????? ??? ?? ? ???? ??? ??? ????.  ??? ?????? ????? ?????. ?? ????? ??? ????? ????? ??:  ???? ??? ??? ???.  ???? ??? ?? ??? ?? ???? ?? ?????? ???.  ???? ?? ???? ???? ?? ?????? ??? ?? ? ?????? ????? ??? ???.  ???? ???? ?? ???? ??? ?? ???? ???.  ?? ????? ?? ???.  ????? ??? ????? ? ?????? ??? ?? ?? ????. ?????? ????? ?????? ??? ??:  ???? ?? ??????? ????? ????? ?????? ?? ?? ?? ?????? ??? ???.  ???? ?????? ? ????? ??????? ?? ? ??? ?? ????? ???.  ???? ? ???? ??? ?? ? ??? ???? ???.  ???? ????? ???? ?? ????? ???? ??? ?? ??? ??? ?? ? ???? ?? ???? ?????? ?? ??? ?????.  ???? ?? ???? ????.  ????? ???? ???? ???? ?? ??? ??.  ???? ??? ???? ??? ???? ???? ?? ?????. ?? ??? ????? ????? ?? ? ??? ?????? ????????? ????

## 2020-11-09 NOTE — Progress Notes (Signed)
ASSESSMENT AND PLAN    #30 year old male from [place], [place] speaking, with epigastric pain, nausea and reported substantial weight loss over the last several months.  Stable weights in epic over last 3 months . Suspect some degree of associated malnourishment with low serum albumin.  History is difficult to obtain , not only due to language barrier but he has been evaluated / treaed in multiple places including [place] (prior to relocating to the [place]) and then at a refugee camp in [place] and somewhere in [place]. It seems like his symptoms resolve with omeprazole but recur off treatment.  -- He may have had an upper endoscopy at some point in [place], retrieving records would be difficult.  I think at this point it is reasonable to proceed with EGD for further evaluation. The risks and benefits of EGD with possible biopsies were discussed with the patient who agrees to proceed.  --Resume Omeprazole 20 mg 30 minutes prior to breakfast -- PCP has ordered an abdominal US but it has not yet been scheduled.  It is fine if wants to PCP holds off on ultrasound until after EGD  # Heartburn ( when off PPI) -- Should improve with resumption of PPI.  He needs to limit caffeine intake.  He drinks a lot of black tea  HISTORY OF PRESENT ILLNESS     Chief Complaint : GERD, abdominal pain , history of H. pylori  Eric Mcbride is a 30 y.o. male with a past medical history significant for H. pylori infection, GERD. See PMH below for any additional history.   Patient is from [place] and he does not speak [place]. Interpreter used for this visit today . He is referred by PCP for GERD, abdominal pain and history of H. Pylori.   Patient relocated to the [place] in January 2022. He arrived at that time to a refugee camp in [place].  A couple of years prior to the [place] patient was taking omeprazole for intermittent upper abdominal burning .  His pain was responsive to omeprazole  which he took as needed.   Within 3 month of arriving to the [place] patient developed recurrent upper abdominal pain as he had not had access to omeprazole. While in [place] at the refugee camp he was apparently diagnosed with H. pylori infection and treated with antibiotics with resolution of symptoms .  He was not maintained on PPI and at some point he had recurrent upper abdominal pain.  At that time he was living in somewhere in [place].  Sounds like he had an upper endoscopy with findings of "irritation a provider there prescribed omeprazole and he again had resolution of abdominal pain.  He ran out of medication and shortly afterwards developed recurrent upper abdominal pain. By by this time he was living in [place] and went to ED in [place] in July and August of this year.  He was prescribed Carafate and omeprazole and the upper abdominal pain resolved .   He did fine until the prescriptions ran out.  Now with recurrent upper abdominal burning.  Additionally he has postprandial abdominal pain.  His upper abdomen feels heavy.  He has nausea without vomiting.  Additionally he complains of heartburn since being off omeprazole .  He drinks a lot of dark tea. He has unintentionally lost 20 pounds over the last several months.    Data Reviewed: Patient has established care with Dionisio David, NP.  H.pylori breath test is negative .  An  abdominal ultrasound has been ordered but not yet scheduled.  ESR, TSH normal.  Lipase normal.  Albumin 2.3, liver chemistries otherwise normal.  CBC normal.    Past Medical History:  Diagnosis Date   Acid reflux disease    H. pylori infection      Past Surgical History:  Procedure Laterality Date   NO PAST SURGERIES     Family History  Family history unknown: Yes   Social History   Tobacco Use   Smoking status: Never   Smokeless tobacco: Never  Vaping Use   Vaping Use: Never used  Substance Use Topics   Alcohol use: Not Currently   Drug  use: Not Currently   No current outpatient medications on file.   No current facility-administered medications for this visit.   No Known Allergies   Review of Systems: All other systems reviewed and negative except where noted in HPI.    PHYSICAL EXAM :    Wt Readings from Last 3 Encounters:  11/09/20 125 lb 8 oz (56.9 kg)  10/19/20 122 lb 8 oz (55.6 kg)  10/01/20 122 lb (55.3 kg)    BP 90/70 (BP Location: Left Arm, Patient Position: Sitting, Cuff Size: Normal)   Pulse 68   Ht 5' 6.25" (1.683 m) Comment: height measured without shoes  Wt 125 lb 8 oz (56.9 kg)   BMI 20.10 kg/m  Constitutional:  Generally well appearing but thin male in no acute distress. Psychiatric: Pleasant. Normal mood and affect. Behavior is normal. EENT: Pupils normal.  Conjunctivae are normal. No scleral icterus. Neck supple.  Cardiovascular: Normal rate, regular rhythm. No edema Pulmonary/chest: Effort normal and breath sounds normal. No wheezing, rales or rhonchi. Abdominal: Soft, nondistended, nontender. Bowel sounds active throughout. There are no masses palpable. No hepatomegaly. Neurological: Alert and oriented to person place and time. Skin: Skin is warm and dry. No rashes noted.  Tye Savoy, NP  11/09/2020, 2:38 PM  Cc:  Referring Provider Vevelyn Francois, NP

## 2020-11-10 NOTE — Progress Notes (Signed)
Agree with assessment and plan as outlined.  

## 2020-11-14 ENCOUNTER — Other Ambulatory Visit: Payer: Self-pay

## 2020-11-14 ENCOUNTER — Ambulatory Visit (AMBULATORY_SURGERY_CENTER): Payer: BC Managed Care – PPO | Admitting: Gastroenterology

## 2020-11-14 ENCOUNTER — Encounter: Payer: Self-pay | Admitting: Gastroenterology

## 2020-11-14 VITALS — BP 119/81 | HR 61 | Temp 98.7°F | Resp 15 | Ht 66.25 in | Wt 125.0 lb

## 2020-11-14 DIAGNOSIS — R1013 Epigastric pain: Secondary | ICD-10-CM

## 2020-11-14 DIAGNOSIS — D131 Benign neoplasm of stomach: Secondary | ICD-10-CM

## 2020-11-14 DIAGNOSIS — K219 Gastro-esophageal reflux disease without esophagitis: Secondary | ICD-10-CM

## 2020-11-14 DIAGNOSIS — K317 Polyp of stomach and duodenum: Secondary | ICD-10-CM | POA: Diagnosis not present

## 2020-11-14 DIAGNOSIS — K449 Diaphragmatic hernia without obstruction or gangrene: Secondary | ICD-10-CM

## 2020-11-14 DIAGNOSIS — K2970 Gastritis, unspecified, without bleeding: Secondary | ICD-10-CM | POA: Diagnosis not present

## 2020-11-14 MED ORDER — OMEPRAZOLE 20 MG PO CPDR: DELAYED_RELEASE_CAPSULE | ORAL | 2 refills | Status: DC

## 2020-11-14 MED ORDER — ONDANSETRON HCL 4 MG PO TABS: ORAL_TABLET | ORAL | 1 refills | Status: DC

## 2020-11-14 MED ORDER — SODIUM CHLORIDE 0.9 % IV SOLN: 500.0000 mL | Freq: Once | INTRAVENOUS | Status: DC

## 2020-11-14 NOTE — Progress Notes (Signed)
Pt stable to RR 

## 2020-11-14 NOTE — Op Note (Signed)
[facility] Patient Name: Eric Mcbride Procedure Date: 11/14/2020 11:01 AM MRN: [id number] Endoscopist: Remo Lipps P. Havery Moros , MD Age: 30 Referring MD:  Date of Birth: 22-Sep-1990 Gender: Male Account #: [id number] Procedure:                Upper GI endoscopy Indications:              recurrent epigastric abdominal pain, Follow-up of                            gastro-esophageal reflux disease, history of                            reported associated weight loss - reportedly prior                            improvement with PPI, recently resumed omeprazole                            20mg  / day with some improvement Medicines:                Monitored Anesthesia Care Procedure:                Pre-Anesthesia Assessment:                           - Prior to the procedure, a History and Physical                            was performed, and patient medications and                            allergies were reviewed. The patient's tolerance of                            previous anesthesia was also reviewed. The risks                            and benefits of the procedure and the sedation                            options and risks were discussed with the patient.                            All questions were answered, and informed consent                            was obtained. Prior Anticoagulants: The patient has                            taken no previous anticoagulant or antiplatelet                            agents. ASA Grade Assessment: II - A patient with  mild systemic disease. After reviewing the risks                            and benefits, the patient was deemed in                            satisfactory condition to undergo the procedure.                           After obtaining informed consent, the endoscope was                            passed under direct vision. Throughout the                            procedure, the  patient's blood pressure, pulse, and                            oxygen saturations were monitored continuously. The                            GIF [id number] #[id number] was introduced through the                            mouth, and advanced to the second part of duodenum.                            The upper GI endoscopy was accomplished without                            difficulty. The patient tolerated the procedure                            well. Scope In: Scope Out: Findings:                 Esophagogastric landmarks were identified: the                            Z-line was found at 38 cm, the gastroesophageal                            junction was found at 38 cm and the upper extent of                            the gastric folds was found at 40 cm from the                            incisors.                           A 2 cm hiatal hernia was present.                           The exam of the  esophagus was otherwise normal.                           A single 3 mm sessile polyp was found in the                            gastric body. The polyp was removed with a cold                            biopsy forceps. Resection and retrieval were                            complete.                           The exam of the stomach was otherwise normal.                           Biopsies were taken with a cold forceps in the                            gastric body, at the incisura and in the gastric                            antrum for Helicobacter pylori testing.                           The duodenal bulb and second portion of the                            duodenum were normal. Biopsies for histology were                            taken with a cold forceps for evaluation of celiac                            disease. Complications:            No immediate complications. Estimated blood loss:                            Minimal. Estimated Blood Loss:     Estimated blood loss was  minimal. Impression:               - Esophagogastric landmarks identified.                           - 2 cm hiatal hernia.                           - Normal esophagus otherwise                           - A single gastric polyp. Resected and retrieved.                           -  Normal stomach otherwise - biopsies taken to rule                            out H pylori                           - Normal duodenal bulb and second portion of the                            duodenum. Biopsied.                           No overt significant pathology noted. Patient may                            have dyspepsia causing symptoms, has responded to                            PPI in the past. Would try increasing omeprazole to                            twice daily for a few weeks, can add Zofran PRN for                            nausea. Follow up as needed if symptoms persist. Recommendation:           - Patient has a contact number available for                            emergencies. The signs and symptoms of potential                            delayed complications were discussed with the                            patient. Return to normal activities tomorrow.                            Written discharge instructions were provided to the                            patient.                           - Resume previous diet.                           - Continue present medications.                           - Increase omeprazole to twice daily dosing for 1                            month                           -  Add Zofran 4mg  ODT every 6 hours PRN nausea                           - Await pathology results. Remo Lipps P. Winfred Redel, MD 11/14/2020 11:30:15 AM This report has been signed electronically.

## 2020-11-14 NOTE — Progress Notes (Signed)
History and Physical Interval Note: Patient recently seen on 11/09/20. See clinic note for details of that visit. Ongoing epigastric pain, nausea. Weight loss endorsed in the chart but patient denies that today. Has been placed no omeprazole 20mg  / day in recent days, he thinks has helped somewhat but symptoms not resolved. I have discussed EGD and anesthesia with him, he wishes to proceed. No translator here today but he states he speaks english, states he understands, he responds to questions appropriately and wishes to proceed.   11/14/2020 11:08 AM  Eric Mcbride  has presented today for endoscopic procedure(s), with the diagnosis of  Encounter Diagnoses  Name Primary?   Abdominal pain, epigastric Yes   Gastroesophageal reflux disease, unspecified whether esophagitis present   .  The various methods of evaluation and treatment have been discussed with the patient and/or family. After consideration of risks, benefits and other options for treatment, the patient has consented to  the endoscopic procedure(s).   The patient's history has been reviewed, patient examined, no change in status, stable for surgery.  I have reviewed the patient's chart and labs.  Questions were answered to the patient's satisfaction.    Jolly Mango, MD [facility]

## 2020-11-14 NOTE — Patient Instructions (Signed)
YOU HAD AN ENDOSCOPIC PROCEDURE TODAY AT [facility]:   Refer to the procedure report that was given to you for any specific questions about what was found during the examination.  If the procedure report does not answer your questions, please call your gastroenterologist to clarify.  If you requested that your care partner not be given the details of your procedure findings, then the procedure report has been included in a sealed envelope for you to review at your convenience later.  YOU SHOULD EXPECT: Some feelings of bloating in the abdomen. Passage of more gas than usual.  Walking can help get rid of the air that was put into your GI tract during the procedure and reduce the bloating. If you had a lower endoscopy (such as a colonoscopy or flexible sigmoidoscopy) you may notice spotting of blood in your stool or on the toilet paper. If you underwent a bowel prep for your procedure, you may not have a normal bowel movement for a few days.  Please Note:  You might notice some irritation and congestion in your nose or some drainage.  This is from the oxygen used during your procedure.  There is no need for concern and it should clear up in a day or so.  SYMPTOMS TO REPORT IMMEDIATELY:  Following upper endoscopy (EGD)  Vomiting of blood or coffee ground material  New chest pain or pain under the shoulder blades  Painful or persistently difficult swallowing  New shortness of breath  Fever of 100F or higher  Black, tarry-looking stools  For urgent or emergent issues, a gastroenterologist can be reached at any hour by calling [phone]. Do not use MyChart messaging for urgent concerns.    DIET:  We do recommend a small meal at first, but then you may proceed to your regular diet.  Drink plenty of fluids but you should avoid alcoholic beverages for 24 hours.  ACTIVITY:  You should plan to take it easy for the rest of today and you should NOT DRIVE or use heavy machinery until  tomorrow (because of the sedation medicines used during the test).    FOLLOW UP: Our staff will call the number listed on your records 48-72 hours following your procedure to check on you and address any questions or concerns that you may have regarding the information given to you following your procedure. If we do not reach you, we will leave a message.  We will attempt to reach you two times.  During this call, we will ask if you have developed any symptoms of COVID 19. If you develop any symptoms (ie: fever, flu-like symptoms, shortness of breath, cough etc.) before then, please call [phone].  If you test positive for Covid 19 in the 2 weeks post procedure, please call and report this information to [place].    If any biopsies were taken you will be contacted by phone or by letter within the next 1-3 weeks.  Please call us at [phone] if you have not heard about the biopsies in 3 weeks.    SIGNATURES/CONFIDENTIALITY: You and/or your care partner have signed paperwork which will be entered into your electronic medical record.  These signatures attest to the fact that that the information above on your After Visit Summary has been reviewed and is understood.  Full responsibility of the confidentiality of this discharge information lies with you and/or your care-partner.    RESUME MEDICATIONS

## 2020-11-14 NOTE — Progress Notes (Signed)
Called to room to assist during endoscopic procedure.  Patient ID and intended procedure confirmed with present staff. Received instructions for my participation in the procedure from the performing physician.  

## 2020-11-14 NOTE — Progress Notes (Signed)
Case worker name Twanna Hy NUMBER [phone]. USE INTERPRETER VIA PHONE PT. VERBALIZE UNDERSTANDING.

## 2020-11-16 ENCOUNTER — Telehealth: Payer: Self-pay | Admitting: *Deleted

## 2020-11-16 NOTE — Telephone Encounter (Signed)
  Follow up Call-  Call back number 11/14/2020  Post procedure Call Back phone  # [phone]  Permission to leave phone message Yes     Patient questions:  VM has not been set up.

## 2020-11-16 NOTE — Telephone Encounter (Signed)
  Follow up Call-  Call back number 11/14/2020  Post procedure Call Back phone  # [phone]  Permission to leave phone message Yes     Patient questions:   VM has not been set up.

## 2020-11-26 ENCOUNTER — Other Ambulatory Visit: Payer: Self-pay

## 2020-12-02 ENCOUNTER — Encounter: Payer: Self-pay | Admitting: *Deleted

## 2020-12-04 NOTE — Congregational Nurse Program (Signed)
  Dept: [facility] Nurse Program Note  Date of Encounter: 12/02/2020  Past Medical History: Past Medical History:  Diagnosis Date   Acid reflux disease    H. pylori infection     Encounter Details:  CNP Questionnaire - 12/02/20 1256       Questionnaire   Do you give verbal consent to treat you today? Yes    Location Patient [place]    Visit Setting Phone/Text/Email    Patient Status [place] or [place] Referral N/A    Medication N/A    Medical Provider Yes    Screening Referrals N/A    Medical Referral [facility]    Medical Appointment Made [facility]    Food N/A    Transportation Need transportation assistance    Housing/Utilities N/A    Interpersonal Safety N/A    Intervention [place] N/A            This CN received a call from client saying that he needs an MD appointment due to back and leg pain.  I have called the patient care center and have left messages for them to call me back to set up an appointment for client.  Will await call from patient care center and then will follow up with client.    Karene Fry, RN, MSN, [facility] Office [phone] Cell

## 2020-12-09 ENCOUNTER — Emergency Department (HOSPITAL_COMMUNITY)
Admission: EM | Admit: 2020-12-09 | Discharge: 2020-12-10 | Disposition: A | Discharge status: Home / Self Care | Payer: BC Managed Care – PPO | Attending: Emergency Medicine | Admitting: Emergency Medicine

## 2020-12-09 DIAGNOSIS — M546 Pain in thoracic spine: Secondary | ICD-10-CM | POA: Insufficient documentation

## 2020-12-09 DIAGNOSIS — M5441 Lumbago with sciatica, right side: Secondary | ICD-10-CM | POA: Diagnosis present

## 2020-12-09 DIAGNOSIS — G8929 Other chronic pain: Secondary | ICD-10-CM | POA: Insufficient documentation

## 2020-12-09 DIAGNOSIS — Z77098 Contact with and (suspected) exposure to other hazardous, chiefly nonmedicinal, chemicals: Secondary | ICD-10-CM | POA: Insufficient documentation
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
## 2020-12-10 ENCOUNTER — Other Ambulatory Visit: Payer: Self-pay

## 2020-12-10 ENCOUNTER — Encounter: Payer: Self-pay | Admitting: *Deleted

## 2020-12-10 ENCOUNTER — Encounter: Payer: Self-pay | Admitting: Nurse Practitioner

## 2020-12-10 ENCOUNTER — Ambulatory Visit (INDEPENDENT_AMBULATORY_CARE_PROVIDER_SITE_OTHER): Payer: BC Managed Care – PPO | Admitting: Nurse Practitioner

## 2020-12-10 ENCOUNTER — Ambulatory Visit (HOSPITAL_COMMUNITY)
Admission: RE | Admit: 2020-12-10 | Discharge: 2020-12-10 | Disposition: A | Discharge status: Home / Self Care | Payer: BC Managed Care – PPO | Source: Ambulatory Visit | Attending: Nurse Practitioner | Admitting: Nurse Practitioner

## 2020-12-10 VITALS — BP 124/93 | HR 72 | Temp 98.2°F | Ht 66.25 in | Wt 121.4 lb

## 2020-12-10 DIAGNOSIS — M546 Pain in thoracic spine: Secondary | ICD-10-CM

## 2020-12-10 DIAGNOSIS — G8929 Other chronic pain: Secondary | ICD-10-CM | POA: Diagnosis not present

## 2020-12-10 DIAGNOSIS — M5441 Lumbago with sciatica, right side: Secondary | ICD-10-CM | POA: Insufficient documentation

## 2020-12-10 MED ORDER — FLUORESCEIN SODIUM 1 MG OP STRP
2.0000 | ORAL_STRIP | Freq: Once | OPHTHALMIC | Status: AC
  Administered 2020-12-10: 2 via OPHTHALMIC

## 2020-12-10 MED ORDER — PROPARACAINE HCL 0.5 % OP SOLN
1.0000 [drp] | Freq: Once | OPHTHALMIC | Status: AC
  Administered 2020-12-10: 1 [drp] via OPHTHALMIC

## 2020-12-10 MED ORDER — ACETAMINOPHEN ER 650 MG PO TBCR: 650.0000 mg | EXTENDED_RELEASE_TABLET | Freq: Three times a day (TID) | ORAL | 0 refills | Status: AC | PRN

## 2020-12-10 MED ORDER — MELOXICAM 15 MG PO TABS: 15.0000 mg | ORAL_TABLET | Freq: Every day | ORAL | 0 refills | Status: DC

## 2020-12-10 NOTE — Discharge Instructions (Signed)
Return to the ED with any new or worsening symptoms

## 2020-12-10 NOTE — Patient Instructions (Addendum)
Back Xray at [place] or Strain Rehab Ask your health care provider which exercises are safe for you. Do exercises exactly as told by your health care provider and adjust them as directed. It is normal to feel mild stretching, pulling, tightness, or discomfort as you do these exercises. Stop right away if you feel sudden pain or your pain gets worse. Do not begin these exercises until told by your health care provider. Stretching and range-of-motion exercises These exercises warm up your muscles and joints and improve the movement and flexibility of your back. These exercises also help to relieve pain, numbness, and tingling. Lumbar rotation  Lie on your back on a firm bed or the floor with your knees bent. Straighten your arms out to your sides so each arm forms a 90-degree angle (right angle) with a side of your body. Slowly move (rotate) both of your knees to one side of your body until you feel a stretch in your lower back (lumbar). Try not to let your shoulders lift off the floor. Hold this position for __________ seconds. Tense your abdominal muscles and slowly move your knees back to the starting position. Repeat this exercise on the other side of your body. Repeat __________ times. Complete this exercise __________ times a day. Single knee to chest  Lie on your back on a firm bed or the floor with both legs straight. Bend one of your knees. Use your hands to move your knee up toward your chest until you feel a gentle stretch in your lower back and buttock. Hold your leg in this position by holding on to the front of your knee. Keep your other leg as straight as possible. Hold this position for __________ seconds. Slowly return to the starting position. Repeat with your other leg. Repeat __________ times. Complete this exercise __________ times a day. Prone extension on elbows  Lie on your abdomen on a firm bed or the floor (prone position). Prop yourself up on  your elbows. Use your arms to help lift your chest up until you feel a gentle stretch in your abdomen and your lower back. This will place some of your body weight on your elbows. If this is uncomfortable, try stacking pillows under your chest. Your hips should stay down, against the surface that you are lying on. Keep your hip and back muscles relaxed. Hold this position for __________ seconds. Slowly relax your upper body and return to the starting position. Repeat __________ times. Complete this exercise __________ times a day. Strengthening exercises These exercises build strength and endurance in your back. Endurance is the ability to use your muscles for a long time, even after they get tired. Pelvic tilt This exercise strengthens the muscles that lie deep in the abdomen. Lie on your back on a firm bed or the floor with your legs extended. Bend your knees so they are pointing toward the ceiling and your feet are flat on the floor. Tighten your lower abdominal muscles to press your lower back against the floor. This motion will tilt your pelvis so your tailbone points up toward the ceiling instead of pointing to your feet or the floor. To help with this exercise, you may place a small towel under your lower back and try to push your back into the towel. Hold this position for __________ seconds. Let your muscles relax completely before you repeat this exercise. Repeat __________ times. Complete this exercise __________ times a day. Alternating arm and  leg raises  Get on your hands and knees on a firm surface. If you are on a hard floor, you may want to use padding, such as an exercise mat, to cushion your knees. Line up your arms and legs. Your hands should be directly below your shoulders, and your knees should be directly below your hips. Lift your left leg behind you. At the same time, raise your right arm and straighten it in front of you. Do not lift your leg higher than your  hip. Do not lift your arm higher than your shoulder. Keep your abdominal and back muscles tight. Keep your hips facing the ground. Do not arch your back. Keep your balance carefully, and do not hold your breath. Hold this position for __________ seconds. Slowly return to the starting position. Repeat with your right leg and your left arm. Repeat __________ times. Complete this exercise __________ times a day. Abdominal set with straight leg raise  Lie on your back on a firm bed or the floor. Bend one of your knees and keep your other leg straight. Tense your abdominal muscles and lift your straight leg up, 4-6 inches (10-15 cm) off the ground. Keep your abdominal muscles tight and hold this position for __________ seconds. Do not hold your breath. Do not arch your back. Keep it flat against the ground. Keep your abdominal muscles tense as you slowly lower your leg back to the starting position. Repeat with your other leg. Repeat __________ times. Complete this exercise __________ times a day. Single leg lower with bent knees Lie on your back on a firm bed or the floor. Tense your abdominal muscles and lift your feet off the floor, one foot at a time, so your knees and hips are bent in 90-degree angles (right angles). Your knees should be over your hips and your lower legs should be parallel to the floor. Keeping your abdominal muscles tense and your knee bent, slowly lower one of your legs so your toe touches the ground. Lift your leg back up to return to the starting position. Do not hold your breath. Do not let your back arch. Keep your back flat against the ground. Repeat with your other leg. Repeat __________ times. Complete this exercise __________ times a day. Posture and body mechanics Good posture and healthy body mechanics can help to relieve stress in your body's tissues and joints. Body mechanics refers to the movements and positions of your body while you do your daily  activities. Posture is part of body mechanics. Good posture means: Your spine is in its natural S-curve position (neutral). Your shoulders are pulled back slightly. Your head is not tipped forward (neutral). Follow these guidelines to improve your posture and body mechanics in your everyday activities. Standing  When standing, keep your spine neutral and your feet about hip-width apart. Keep a slight bend in your knees. Your ears, shoulders, and hips should line up. When you do a task in which you stand in one place for a long time, place one foot up on a stable object that is 2-4 inches (5-10 cm) high, such as a footstool. This helps keep your spine neutral. Sitting  When sitting, keep your spine neutral and keep your feet flat on the floor. Use a footrest, if necessary, and keep your thighs parallel to the floor. Avoid rounding your shoulders, and avoid tilting your head forward. When working at a desk or a computer, keep your desk at a height where your hands are  slightly lower than your elbows. Slide your chair under your desk so you are close enough to maintain good posture. When working at a computer, place your monitor at a height where you are looking straight ahead and you do not have to tilt your head forward or downward to look at the screen. Resting When lying down and resting, avoid positions that are most painful for you. If you have pain with activities such as sitting, bending, stooping, or squatting, lie in a position in which your body does not bend very much. For example, avoid curling up on your side with your arms and knees near your chest (fetal position). If you have pain with activities such as standing for a long time or reaching with your arms, lie with your spine in a neutral position and bend your knees slightly. Try the following positions: Lying on your side with a pillow between your knees. Lying on your back with a pillow under your knees. Lifting  When lifting  objects, keep your feet at least shoulder-width apart and tighten your abdominal muscles. Bend your knees and hips and keep your spine neutral. It is important to lift using the strength of your legs, not your back. Do not lock your knees straight out. Always ask for help to lift heavy or awkward objects. This information is not intended to replace advice given to you by your health care provider. Make sure you discuss any questions you have with your health care provider. Document Revised: 03/12/2020 Document Reviewed: 03/12/2020 Elsevier Patient Education  [place].
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
## 2020-12-10 NOTE — Congregational Nurse Program (Signed)
  Dept: [facility] Nurse Program Note  Date of Encounter: 12/10/2020  Past Medical History: Past Medical History:  Diagnosis Date   Acid reflux disease    H. pylori infection     Encounter Details:  CNP Questionnaire - 12/10/20 1040       Questionnaire   Do you give verbal consent to treat you today? Yes    Location Patient [place]    Visit Setting MD Office    Patient Status Refugee    Insurance Private or [place] Referral N/A    Medication N/A    Medical Provider Yes    Screening Referrals N/A    Medical Referral [facility]    Medical Appointment Made [facility]    Food N/A    Transportation Provided transportation assistance    Housing/Utilities N/A    Interpersonal Safety N/A    Intervention [place] [facility];Educate;Support    ED Visit Averted Yes    Life-Saving Intervention Made N/A            Transportation provided to client for appointment at patient care center with PCP.  Follow up appointment made for 6 months by provider.  Transportation provided to [employer] for prescription pick up.  This CN explained how to take prescription and had client repeat instructions.  Client to follow up with this CN as needed.  Karene Fry, RN, MSN, [place] Office [phone] Cell

## 2020-12-10 NOTE — Progress Notes (Signed)
Eric Mcbride, Eric Mcbride  [id number] Phone:  [phone]   Fax:  [phone]   Established Patient Office Visit  Subjective:  Patient ID: Eric Mcbride, male    DOB: May 27, 1990  Age: 30 y.o. MRN: [id number]  CC:  Chief Complaint  Patient presents with   Back Pain    Pt came in stated he has back pain and RT lower leg pain stated he has this pain a month. Pt has been taking over the counter pain meds. Pt is not sure what is the name of the medications   Eric Mcbride is a 30 y.o. male who presents for evaluation of low back pain. The patient has had no prior back problems. Symptoms have been present for 1 month and are unchanged.  Onset was related to / precipitated by no known injury. The pain is located in the radiating to left  abdomen and right leg into foot on top an below continues and left leg occasional . The leg pain is worse than the back. The pain is described as aching and occurs all day. 7/10 Symptoms are exacerbated by working and driving for long distance. Symptoms are improved by nothing. He has also tried nothing which provided no symptom relief. He has no other symptoms associated with the back pain. He is unsure of any weakness. The patient has no "red flag" history indicative of complicated back pain. He is not sure what has caused the back pain. He was in the [employer] in [place]. He is not sure if this may have been the cause.   Past Medical History:  Diagnosis Date   Acid reflux disease    H. pylori infection     Past Surgical History:  Procedure Laterality Date   NO PAST SURGERIES      Family History  Family history unknown: Yes    Social History   Socioeconomic History   Marital status: Married    Spouse name: Not on file   Number of children: 1   Years of education: Not on file   Highest education level: Not on file  Occupational History   Occupation: [employer]  Tobacco Use   Smoking status:  Never   Smokeless tobacco: Never  Vaping Use   Vaping Use: Never used  Substance and Sexual Activity   Alcohol use: Not Currently   Drug use: Not Currently   Sexual activity: Yes    Birth control/protection: None  Other Topics Concern   Not on file  Social History Narrative   Not on file   Social Determinants of Health   Financial Resource Strain: Not on file  Food Insecurity: Not on file  Transportation Needs: Not on file  Physical Activity: Not on file  Stress: Not on file  Social Connections: Not on file  Intimate Partner Violence: Not on file    Outpatient Medications Prior to Visit  Medication Sig Dispense Refill   omeprazole (PRILOSEC) 20 MG capsule Take 1 capsule (20 mg total) by mouth daily before breakfast. 30 capsule 3   omeprazole (PRILOSEC) 20 MG capsule TAKE TWO TIMES DAILY FOR 1 MONTH 60 capsule 2   ondansetron (ZOFRAN) 4 MG tablet TAKE EVERY SIX HOURS AS NEEDED FOR NAUSEA (Patient not taking: Reported on 12/10/2020) 30 tablet 1   No facility-administered medications prior to visit.    No Known Allergies  ROS Review of Systems    Objective:    Physical Exam Exam conducted with  a chaperone present.  Constitutional:      General: He is not in acute distress.    Appearance: He is not ill-appearing, toxic-appearing or diaphoretic.  HENT:     Head: Normocephalic and atraumatic.     Nose: Nose normal.     Mouth/Throat:     Mouth: Mucous membranes are moist.  Cardiovascular:     Rate and Rhythm: Normal rate.     Pulses: Normal pulses.  Pulmonary:     Effort: Pulmonary effort is normal.  Abdominal:     General: Abdomen is flat.     Tenderness: There is no right CVA tenderness, left CVA tenderness or guarding.  Musculoskeletal:        General: Normal range of motion.     Cervical back: Normal range of motion.     Thoracic back: Deformity (mild asymmetry noted to left) and tenderness present.     Lumbar back: Tenderness present. No swelling. Negative  right straight leg raise test and negative left straight leg raise test.     Comments: Figure 4 negative  Skin:    General: Skin is warm and dry.     Capillary Refill: Capillary refill takes less than 2 seconds.  Neurological:     General: No focal deficit present.     Mental Status: He is alert and oriented to person, place, and time.  Psychiatric:        Mood and Affect: Mood normal.        Behavior: Behavior normal.        Thought Content: Thought content normal.        Judgment: Judgment normal.    BP (!) 124/93   Pulse 72   Temp 98.2 F (36.8 C)   Ht 5' 6.25" (1.683 m)   Wt 121 lb 6 oz (55.1 kg)   SpO2 99%   BMI 19.44 kg/m  Wt Readings from Last 3 Encounters:  12/10/20 121 lb 6 oz (55.1 kg)  11/14/20 125 lb (56.7 kg)  11/09/20 125 lb 8 oz (56.9 kg)     Health Maintenance Due  Topic Date Due   Pneumococcal Vaccine 40-14 Years old (1 - PCV) Never done   COVID-19 Vaccine (3 - Booster for Janssen series) 03/24/2020    There are no preventive care reminders to display for this patient.  Lab Results  Component Value Date   TSH 1.130 10/01/2020   Lab Results  Component Value Date   WBC 4.2 10/19/2020   HGB 16.0 10/19/2020   HCT 45.7 10/19/2020   MCV 81 10/19/2020   PLT 240 10/19/2020   Lab Results  Component Value Date   NA 140 10/01/2020   K 4.1 10/01/2020   GLUCOSE 92 10/01/2020   BUN 14 10/01/2020   CREATININE 0.81 10/01/2020   BILITOT 0.6 10/01/2020   ALKPHOS 71 10/01/2020   AST 30 10/01/2020   PROT 7.0 10/01/2020   ALBUMIN 4.9 10/01/2020   CALCIUM 9.5 10/01/2020   EGFR 123 10/01/2020   No results found for: CHOL No results found for: HDL No results found for: LDLCALC No results found for: TRIG No results found for: CHOLHDL No results found for: HGBA1C    Assessment & Plan:   Problem List Items Addressed This Visit   None Visit Diagnoses     Acute left-sided thoracic back pain    -  Primary   Relevant Medications   acetaminophen  (TYLENOL) 650 MG CR tablet   Other Relevant Orders  DG Thoracic Spine 2 View (Completed)   Chronic bilateral low back pain with right-sided sciatica       Relevant Medications   acetaminophen (TYLENOL) 650 MG CR tablet   Other Relevant Orders   DG Lumbar Spine Complete (Completed)       Meds ordered this encounter  Medications   DISCONTD: meloxicam (MOBIC) 15 MG tablet    Sig: Take 1 tablet (15 mg total) by mouth daily.    Dispense:  30 tablet    Refill:  0    Order Specific Question:   Supervising Provider    Answer:   JEGEDE, OLUGBEMIGA E [[id number]]   acetaminophen (TYLENOL) 650 MG CR tablet    Sig: Take 1 tablet (650 mg total) by mouth every 8 (eight) hours as needed for up to 7 days for pain.    Dispense:  21 tablet    Refill:  0    Order Specific Question:   Supervising Provider    Answer:   JEGEDE, OLUGBEMIGA E [[id number]]    Follow-up: Return in about 6 months (around 06/10/2021).    Crystal M King, NP  

## 2020-12-18 ENCOUNTER — Encounter: Payer: Self-pay | Admitting: *Deleted

## 2020-12-18 NOTE — Congregational Nurse Program (Signed)
°  Dept: [facility] Nurse Program Note  Date of Encounter: 12/18/2020  Past Medical History: Past Medical History:  Diagnosis Date   Acid reflux disease    H. pylori infection     Encounter Details:  CNP Questionnaire - 12/18/20 1252       Questionnaire   Do you give verbal consent to treat you today? Yes    Location Patient [place]    Visit Setting Phone/Text/Email    Patient Status [place] or [place] Referral N/A    Medication N/A    Medical Provider Yes    Screening Referrals N/A    Medical Referral N/A    Medical Appointment Made [facility]CP[facility]inic;N/A    Food N/A    Transportation Need transportation assistance    Housing/Utilities N/A    Interpersonal Safety N/A    Intervention [employer]ch[employer], retail;Advocate    ED Visit Averted Yes    Life-Saving Intervention Made N/A            This CN received request from client to call about a bill received from [facility].  Client sent this CN his insurance information and his bill.  Called [employer] for benefits for client and called billing for assistance.

## 2021-01-02 ENCOUNTER — Encounter: Payer: Self-pay | Admitting: *Deleted

## 2021-01-02 NOTE — Congregational Nurse Program (Signed)
°  Dept: [facility] Nurse Program Note  Date of Encounter: 01/02/2021  Past Medical History: Past Medical History:  Diagnosis Date   Acid reflux disease    H. pylori infection     Encounter Details:  CNP Questionnaire - 01/02/21 1558       Questionnaire   Do you give verbal consent to treat you today? Yes    Location Patient [place]    Visit Setting Phone/Text/Email    Patient Status Refugee    Insurance Private or [place] Referral N/A    Medication N/A    Medical Provider Yes    Screening Referrals N/A    Medical Referral N/A    Medical Appointment Made [facility]    Food N/A    Transportation Need transportation assistance    Housing/Utilities N/A    Interpersonal Safety N/A    Intervention Merchandiser, retail;Advocate    ED Visit Averted Yes    Life-Saving Intervention Made N/A            Received call/text from client that he is having "bad stomach pain".  Call placed to [facility] patient care center and appointment made for Friday Dec 30th at 8:20 am.  This CN will notify client of appointment and will set up transportation for client.  Karene Fry, RN, MSN, [place] Office [phone] Cell

## 2021-01-04 ENCOUNTER — Other Ambulatory Visit: Payer: Self-pay

## 2021-01-04 ENCOUNTER — Encounter: Payer: Self-pay | Admitting: Nurse Practitioner

## 2021-01-04 ENCOUNTER — Encounter: Payer: Self-pay | Admitting: *Deleted

## 2021-01-04 ENCOUNTER — Ambulatory Visit (INDEPENDENT_AMBULATORY_CARE_PROVIDER_SITE_OTHER): Payer: BC Managed Care – PPO | Admitting: Nurse Practitioner

## 2021-01-04 VITALS — BP 142/93 | HR 65 | Temp 97.2°F | Ht 66.25 in | Wt 121.0 lb

## 2021-01-04 DIAGNOSIS — K219 Gastro-esophageal reflux disease without esophagitis: Secondary | ICD-10-CM

## 2021-01-04 DIAGNOSIS — R1084 Generalized abdominal pain: Secondary | ICD-10-CM

## 2021-01-04 DIAGNOSIS — R1010 Upper abdominal pain, unspecified: Secondary | ICD-10-CM | POA: Diagnosis not present

## 2021-01-04 DIAGNOSIS — G8929 Other chronic pain: Secondary | ICD-10-CM

## 2021-01-04 DIAGNOSIS — R1013 Epigastric pain: Secondary | ICD-10-CM

## 2021-01-04 DIAGNOSIS — J45909 Unspecified asthma, uncomplicated: Secondary | ICD-10-CM

## 2021-01-04 MED ORDER — SUCRALFATE 1 G PO TABS: 1.0000 g | ORAL_TABLET | Freq: Two times a day (BID) | ORAL | 0 refills | Status: DC

## 2021-01-04 MED ORDER — PANTOPRAZOLE SODIUM 40 MG PO TBEC
40.0000 mg | DELAYED_RELEASE_TABLET | Freq: Every day | ORAL | 0 refills | Status: DC
  Filled 2021-01-04 (×2): qty 30, 30d supply, fill #0

## 2021-01-04 MED ORDER — FLUTICASONE-SALMETEROL 250-50 MCG/ACT IN AEPB
1.0000 | INHALATION_SPRAY | Freq: Two times a day (BID) | RESPIRATORY_TRACT | 2 refills | Status: DC
  Filled 2021-01-04: qty 60, 30d supply, fill #0

## 2021-01-04 MED ORDER — OMEPRAZOLE 20 MG PO CPDR: DELAYED_RELEASE_CAPSULE | ORAL | 2 refills | Status: DC

## 2021-01-04 MED ORDER — PANTOPRAZOLE SODIUM 40 MG PO TBEC: 40.0000 mg | DELAYED_RELEASE_TABLET | Freq: Every day | ORAL | 0 refills | Status: DC

## 2021-01-04 MED ORDER — SUCRALFATE 1 G PO TABS
1.0000 g | ORAL_TABLET | Freq: Two times a day (BID) | ORAL | 0 refills | Status: DC
  Filled 2021-01-04: qty 60, 30d supply, fill #0

## 2021-01-04 NOTE — Progress Notes (Signed)
[address], [place]  [address] Phone:  [phone]   Fax:  [phone] Subjective:   Patient ID: Eric Mcbride, male    DOB: 08/20/1990, 30 y.o.   MRN: [id number]  Chief Complaint  Patient presents with   Abdominal Pain    Pt is here for abdominal pain pt was told he has an ulcer.pt stated he has been losing weight when he eats it feels like a rock is in his stomach it has been a year he has been feeling like this.pt has been taking meloxicam for back pain   HPI Eric Mcbride 30 y.o. male  has a past medical history of Acid reflux disease and H. pylori infection. To the [facility] for abdominal pain.  Patient states that he has had abdominal pain x 1 yr, that has worsened over the past 3-4 days. Pain is located in diffuse abdomen. Currently rates pain 6/10 and describes as cramping. Pain worsens with eating. Denies any improving factors. Was evaluated and treated by GI with no improvements in symptoms. Finished prescribed omeprazole 4 days ago with no improvement in symptoms. Concerned because he is losing weight rapidly, in addition to having increased weakness and fatigue. Currently only eats broth, bread, milk and juice.  Endorses also having dizziness and HA, in addition to nausea without vomiting. Denies any diarrhea, has had constipation due to decreased appetite. Last bowel movement yesterday, typically has one every 3-4 days. Has been taking Zofran with mild improvement in nausea.   States that he has an upcoming appointment with GI, but "I am not going to make it until than. I am so frustrated. No one can tell me what's wrong with me. I feel like I am going to die."    Patient also requesting endoscopy results and refill of medications for asthma. Denies any other complaints today. Denies any fever. Denies any fatigue, chest pain, shortness of breath, HA or dizziness. Denies any blurred vision, numbness or tingling.     Past  Medical History:  Diagnosis Date   Acid reflux disease    H. pylori infection     Past Surgical History:  Procedure Laterality Date   NO PAST SURGERIES      Family History  Family history unknown: Yes    Social History   Socioeconomic History   Marital status: Married    Spouse name: Not on file   Number of children: 1   Years of education: Not on file   Highest education level: Not on file  Occupational History   Occupation: [employer]  Tobacco Use   Smoking status: Never   Smokeless tobacco: Never  Vaping Use   Vaping Use: Never used  Substance and Sexual Activity   Alcohol use: Not Currently   Drug use: Not Currently   Sexual activity: Yes    Birth control/protection: None  Other Topics Concern   Not on file  Social History Narrative   Not on file   Social Determinants of Health   Financial Resource Strain: Not on file  Food Insecurity: Not on file  Transportation Needs: Not on file  Physical Activity: Not on file  Stress: Not on file  Social Connections: Not on file  Intimate Partner Violence: Not on file    Outpatient Medications Prior to Visit  Medication Sig Dispense Refill   ondansetron (ZOFRAN) 4 MG tablet TAKE EVERY SIX HOURS AS NEEDED FOR NAUSEA (Patient not taking: Reported on 12/10/2020) 30 tablet  1   omeprazole (PRILOSEC) 20 MG capsule Take 1 capsule (20 mg total) by mouth daily before breakfast. (Patient not taking: Reported on 01/04/2021) 30 capsule 3   omeprazole (PRILOSEC) 20 MG capsule TAKE TWO TIMES DAILY FOR 1 MONTH (Patient not taking: Reported on 01/04/2021) 60 capsule 2   No facility-administered medications prior to visit.    No Known Allergies  Review of Systems  Constitutional:  Positive for weight loss. Negative for chills, fever and malaise/fatigue.  Respiratory:  Negative for cough and shortness of breath.   Cardiovascular:  Negative for chest pain, palpitations and leg swelling.  Gastrointestinal:  Positive for  abdominal pain, constipation and nausea. Negative for blood in stool, diarrhea and vomiting.  Genitourinary: Negative.   Skin: Negative.   Neurological: Negative.   Psychiatric/Behavioral:  Negative for depression. The patient is not nervous/anxious.   All other systems reviewed and are negative.     Objective:    Physical Exam Vitals reviewed.  Constitutional:      General: He is not in acute distress.    Appearance: Normal appearance. He is normal weight. He is not ill-appearing.  HENT:     Head: Normocephalic.     Mouth/Throat:     Mouth: Mucous membranes are moist.     Pharynx: Oropharynx is clear.  Cardiovascular:     Rate and Rhythm: Normal rate and regular rhythm.     Pulses: Normal pulses.     Heart sounds: Normal heart sounds.     Comments: No obvious peripheral edema Pulmonary:     Effort: Pulmonary effort is normal.     Breath sounds: Normal breath sounds.  Abdominal:     General: Abdomen is flat. Bowel sounds are normal. There is no distension. There are no signs of injury.     Palpations: Abdomen is soft.     Tenderness: There is abdominal tenderness in the left lower quadrant. There is no right CVA tenderness, left CVA tenderness, guarding or rebound. Negative signs include Murphy's sign, McBurney's sign and obturator sign.     Hernia: No hernia is present.  Skin:    General: Skin is warm and dry.     Capillary Refill: Capillary refill takes less than 2 seconds.  Neurological:     General: No focal deficit present.     Mental Status: He is alert.  Psychiatric:        Mood and Affect: Mood normal.        Behavior: Behavior normal.        Thought Content: Thought content normal.        Judgment: Judgment normal.    BP (!) 142/93    Pulse 65    Temp (!) 97.2 F (36.2 C)    Ht 5' 6.25" (1.683 m)    Wt 121 lb (54.9 kg)    SpO2 100%    BMI 19.38 kg/m  Wt Readings from Last 3 Encounters:  01/04/21 121 lb (54.9 kg)  12/10/20 121 lb 6 oz (55.1 kg)  11/14/20  125 lb (56.7 kg)    Immunization History  Administered Date(s) Administered   Influenza,inj,Quad PF,6+ Mos 10/19/2020   Janssen (J&J) SARS-COV-2 Vaccination 09/27/2019   PFIZER(Purple Top)SARS-COV-2 Vaccination 01/28/2020    Diabetic Foot Exam - Simple   No data filed     Lab Results  Component Value Date   TSH 1.130 10/01/2020   Lab Results  Component Value Date   WBC 6.0 01/04/2021   HGB 16.3 01/04/2021  HCT 46.6 01/04/2021   MCV 79 01/04/2021   PLT 241 01/04/2021   Lab Results  Component Value Date   NA 141 01/04/2021   K 4.2 01/04/2021   CO2 24 01/04/2021   GLUCOSE 79 01/04/2021   BUN 21 (H) 01/04/2021   CREATININE 0.89 01/04/2021   BILITOT 1.1 01/04/2021   ALKPHOS 56 01/04/2021   AST 24 01/04/2021   ALT 17 01/04/2021   PROT 7.0 01/04/2021   ALBUMIN 4.9 01/04/2021   CALCIUM 9.9 01/04/2021   EGFR 120 01/04/2021   No results found for: CHOL No results found for: HDL No results found for: LDLCALC No results found for: TRIG No results found for: CHOLHDL No results found for: HGBA1C     Assessment & Plan:   Problem List Items Addressed This Visit   None Visit Diagnoses     Chronic generalized abdominal pain    -  Primary   Relevant Medications   pantoprazole (PROTONIX) 40 MG tablet   sucralfate (CARAFATE) 1 g tablet   Other Relevant Orders   H. pylori breath test (Completed)   Gastroesophageal reflux disease, unspecified whether esophagitis present       Relevant Medications   pantoprazole (PROTONIX) 40 MG tablet   sucralfate (CARAFATE) 1 g tablet   Other Relevant Orders   H. pylori breath test (Completed)   Abdominal pain, epigastric       Relevant Medications   pantoprazole (PROTONIX) 40 MG tablet   sucralfate (CARAFATE) 1 g tablet   Other Relevant Orders   H. pylori breath test (Completed)   Pain of upper abdomen       Relevant Orders   CBC with Differential/Platelet (Completed)   Comprehensive metabolic panel (Completed)   Lipase  (Completed) Patient informed to maintain upcoming follow up with GI on 1/16 Informed to take OTC medications as needed Encouraged compliance with new medications while awaiting GI appointment  Discussed changes in diet at length  Patient informed that if symptoms do not improve with medications, to go to the ED for further evaluation and management of symptoms  Discussed endoscopy results, which were overall benign   Uncomplicated asthma, unspecified asthma severity, unspecified whether persistent       Relevant Medications   fluticasone-salmeterol (ADVAIR DISKUS) 250-50 MCG/ACT AEPB   Maintain upcoming follow up with PCP, return sooner as needed    I have discontinued Enzio Belgard's omeprazole, omeprazole, and omeprazole. I am also having him start on fluticasone-salmeterol. Additionally, I am having him maintain his ondansetron, pantoprazole, and sucralfate.  Meds ordered this encounter  Medications   DISCONTD: omeprazole (PRILOSEC) 20 MG capsule    Sig: TAKE TWO TIMES DAILY FOR 1 MONTH    Dispense:  60 capsule    Refill:  2   DISCONTD: pantoprazole (PROTONIX) 40 MG tablet    Sig: Take 1 tablet (40 mg total) by mouth daily.    Dispense:  30 tablet    Refill:  0   DISCONTD: sucralfate (CARAFATE) 1 g tablet    Sig: Take 1 tablet (1 g total) by mouth 2 (two) times daily with a meal.    Dispense:  60 tablet    Refill:  0   fluticasone-salmeterol (ADVAIR DISKUS) 250-50 MCG/ACT AEPB    Sig: Inhale 1 puff into the lungs in the morning and at bedtime.    Dispense:  60 each    Refill:  2   pantoprazole (PROTONIX) 40 MG tablet    Sig: Take 1 tablet (40  mg total) by mouth daily.    Dispense:  30 tablet    Refill:  0   sucralfate (CARAFATE) 1 g tablet    Sig: Take 1 tablet (1 g total) by mouth 2 (two) times daily with a meal.    Dispense:  60 tablet    Refill:  0     Teena Dunk, NP

## 2021-01-04 NOTE — Patient Instructions (Signed)
You were seen today in the [facility] for abdominal pain. Labs were collected, results will be available via MyChart or, if abnormal, you will be contacted by clinic staff. You were prescribed medications, please take as directed. Please maintain follow up with GI on 01/22/20

## 2021-01-04 NOTE — Congregational Nurse Program (Signed)
°  Dept: [place] Nurse Program Note  Date of Encounter: 01/04/2021  Past Medical History: Past Medical History:  Diagnosis Date   Acid reflux disease    H. pylori infection     Encounter Details:  CNP Questionnaire - 01/04/21 0830       Questionnaire   Do you give verbal consent to treat you today? Yes    Location Patient [place] or Organization    Patient Status Refugee    Insurance Private or [employer] Referral N/A    Medication Have Medication Insecurities    Medical Provider Yes    Medical Referral N/A    Medical Appointment Made [facility] PCP/clinic    Food N/A    Transportation Provided transportation assistance    Housing/Utilities N/A    Interpersonal Safety N/A    Intervention [place];Advocate;Educate;Support    ED Visit Averted Yes    Life-Saving Intervention Made N/A            Transportation provided to patient care center for MD appointment, to [facility] for new prescriptions, to [place] for prescription refills, and to home.  Client reports not feeling well and continues to have stomach pain.  Client to follow up with this CN as needed.  Karene Fry, RN, MSN, [place] Office [phone] Cell

## 2021-01-05 LAB — COMPREHENSIVE METABOLIC PANEL
ALT: 17 IU/L (ref 0–44)
AST: 24 IU/L (ref 0–40)
Albumin/Globulin Ratio: 2.3 — ABNORMAL HIGH (ref 1.2–2.2)
Albumin: 4.9 g/dL (ref 4.1–5.2)
Alkaline Phosphatase: 56 IU/L (ref 44–121)
BUN/Creatinine Ratio: 24 — ABNORMAL HIGH (ref 9–20)
BUN: 21 mg/dL — ABNORMAL HIGH (ref 6–20)
Bilirubin Total: 1.1 mg/dL (ref 0.0–1.2)
CO2: 24 mmol/L (ref 20–29)
Calcium: 9.9 mg/dL (ref 8.7–10.2)
Chloride: 100 mmol/L (ref 96–106)
Creatinine, Ser: 0.89 mg/dL (ref 0.76–1.27)
Globulin, Total: 2.1 g/dL (ref 1.5–4.5)
Glucose: 79 mg/dL (ref 70–99)
Potassium: 4.2 mmol/L (ref 3.5–5.2)
Sodium: 141 mmol/L (ref 134–144)
Total Protein: 7 g/dL (ref 6.0–8.5)
eGFR: 120 mL/min/{1.73_m2} (ref >59)

## 2021-01-05 LAB — CBC WITH DIFFERENTIAL/PLATELET
Basophils Absolute: 0 10*3/uL (ref 0.0–0.2)
Basos: 1 %
EOS (ABSOLUTE): 0.7 10*3/uL — ABNORMAL HIGH (ref 0.0–0.4)
Eos: 12 %
Hematocrit: 46.6 % (ref 37.5–51.0)
Hemoglobin: 16.3 g/dL (ref 13.0–17.7)
Immature Grans (Abs): 0 10*3/uL (ref 0.0–0.1)
Immature Granulocytes: 0 %
Lymphocytes Absolute: 1.9 10*3/uL (ref 0.7–3.1)
Lymphs: 32 %
MCH: 27.4 pg (ref 26.6–33.0)
MCHC: 35 g/dL (ref 31.5–35.7)
MCV: 79 fL (ref 79–97)
Monocytes Absolute: 0.4 10*3/uL (ref 0.1–0.9)
Monocytes: 6 %
Neutrophils Absolute: 3 10*3/uL (ref 1.4–7.0)
Neutrophils: 49 %
Platelets: 241 10*3/uL (ref 150–450)
RBC: 5.94 x10E6/uL — ABNORMAL HIGH (ref 4.14–5.80)
RDW: 12.9 % (ref 11.6–15.4)
WBC: 6 10*3/uL (ref 3.4–10.8)

## 2021-01-05 LAB — LIPASE: Lipase: 49 U/L (ref 13–78)

## 2021-01-05 LAB — H. PYLORI BREATH TEST: H pylori Breath Test: NEGATIVE

## 2021-01-21 ENCOUNTER — Encounter: Payer: Self-pay | Admitting: Nurse Practitioner

## 2021-01-21 ENCOUNTER — Ambulatory Visit (INDEPENDENT_AMBULATORY_CARE_PROVIDER_SITE_OTHER): Payer: BC Managed Care – PPO | Admitting: Nurse Practitioner

## 2021-01-21 VITALS — BP 110/70 | HR 66 | Ht 66.0 in | Wt 121.4 lb

## 2021-01-21 DIAGNOSIS — R1030 Lower abdominal pain, unspecified: Secondary | ICD-10-CM | POA: Diagnosis not present

## 2021-01-21 DIAGNOSIS — R634 Abnormal weight loss: Secondary | ICD-10-CM | POA: Diagnosis not present

## 2021-01-21 DIAGNOSIS — K5900 Constipation, unspecified: Secondary | ICD-10-CM | POA: Diagnosis not present

## 2021-01-21 NOTE — Patient Instructions (Signed)
IMAGING: You will be contacted by [facility] Scheduling (Your caller ID will indicate phone # [phone]) in the next 7 days to schedule your Abdominal CT Scan. If you have not heard from them within 7 business days, please call [place] at [phone] to follow up on the status of your appointment.    RECOMMENDATIONS: Miralax- Dissolve one capful in 8 ounces of water and drink before bed. Ensure or Boost 1-2 bottles daily. Go to the emergency room of you develop severe abdominal pain.  It was great seeing you today! Thank you for entrusting me with your care and choosing [facility]  Noralyn Pick, CRNP  If you are age 62 or older, your body mass index should be between 23-30. Your Body mass index is 19.59 kg/m. If this is out of the aforementioned range listed, please consider follow up with your Primary Care Provider.  If you are age 17 or younger, your body mass index should be between 19-25. Your Body mass index is 19.59 kg/m. If this is out of the aformentioned range listed, please consider follow up with your Primary Care Provider.

## 2021-01-21 NOTE — Progress Notes (Signed)
01/21/2021 Eric Mcbride [id number] [birth year]   Chief Complaint: Stomach pain  History of Present Illness: 31 year old male from [place], [place] speaking, with a history of H. Pylori infection with epigastric pain, nausea and reported substantial weight loss. He was initially seen in our office by Tye Savoy on 11/09/2020. An EGD 11/14/2020 completed by Dr. Havery Moros 11/14/2020 which showed reactive gastropathy without evidence of H. Pylori, a benign hyperplastic gastric polyp and a 2 cm hiatal hernia.  Duodenal biopsies were negative for celiac disease.  He was treated with PPI twice daily and Zofran.  He presents to our office today for further evaluation regarding "stomach pain".  He is non-English speaking therefore he is accompanied by a [place] Pashto interpreter. He continues to have "stomach pain" which is worse each time he eats.  He is lost 23 pounds over the past 8 months.  He has low energy levels.  I asked him to point to the area of his abdomen where he experiences pain, he pointed to the central lower abdomen.  He denies having any LUQ pain.  No further nausea.  No vomiting.  He remains on Pantoprazole 40 mg daily and Sucralfate 1 g p.o. twice daily.  He has frequent headaches and feels lightheaded at times with reduced food intake.  He passes a small formed stool every 3 days.  He possibly saw black stools for few days 2 to 4 weeks ago.  No bright red rectal bleeding.  Denies taking any fiber or laxatives.  He is frustrated and is concerned regarding the lack of his diagnosis.  He is concerned he may have cancer.  He stated he is unable to eat and is all bones.  No known family history of esophageal, gastric or colorectal cancer.  Non-smoker.  No alcohol use.  No drug use.  EGD 11/14/2020: Esophagogastric landmarks identified. - 2 cm hiatal hernia. - Normal esophagus otherwise - A single gastric polyp. Resected and retrieved. - Normal stomach otherwise -  biopsies taken to rule out H pylori - Normal duodenal bulb and second portion of the duodenum. Biopsied Path Report: 1. Surgical [P], duodenal FRAGMENTS NORMAL DUODENAL MUCOSA. THERE ARE NO DIAGNOSTIC FEATURES OF CELIAC DISEASE. 2. Surgical [P], gastric FRAGMENTS OF GASTRIC MUCOSA WITH MILD NONSPECIFIC INFLAMMATION MINIMAL VASCULAR ECTASIA CONSISTENT WITH REACTIVE GASTROPATHY. H. PYLORI AND INTESTINAL METAPLASIA ARE NOT IDENTIFIED. 3. Surgical [P], gastric polyp BENIGN HYPERPLASTIC GASTRIC POLYP. NEGATIVE FOR DYSPLASIA.  CBC Latest Ref Rng & Units 01/04/2021 10/19/2020 10/01/2020  WBC 3.4 - 10.8 x10E3/uL 6.0 4.2 4.2  Hemoglobin 13.0 - 17.7 g/dL 16.3 16.0 17.2  Hematocrit 37.5 - 51.0 % 46.6 45.7 51.3(H)  Platelets 150 - 450 x10E3/uL 241 240 238    CMP Latest Ref Rng & Units 01/04/2021 10/01/2020  Glucose 70 - 99 mg/dL 79 92  BUN 6 - 20 mg/dL 21(H) 14  Creatinine 0.76 - 1.27 mg/dL 0.89 0.81  Sodium 134 - 144 mmol/L 141 140  Potassium 3.5 - 5.2 mmol/L 4.2 4.1  Chloride 96 - 106 mmol/L 100 104  CO2 20 - 29 mmol/L 24 -  Calcium 8.7 - 10.2 mg/dL 9.9 9.5  Total Protein 6.0 - 8.5 g/dL 7.0 7.0  Total Bilirubin 0.0 - 1.2 mg/dL 1.1 0.6  Alkaline Phos 44 - 121 IU/L 56 71  AST 0 - 40 IU/L 24 30  ALT 0 - 44 IU/L 17 -    Current Medications, Allergies, Past Medical History, Past Surgical History, Family History and Social History were  reviewed in [place] record.  Review of Systems:   Constitutional: See HPI.  No fevers or sweats. Respiratory: Negative for shortness of breath.   Cardiovascular: Negative for chest pain, palpitations and leg swelling.  Gastrointestinal: See HPI.  GU: No change in urine color. Musculoskeletal: Negative for back pain or muscle aches.  Neurological: Negative for dizziness, headaches or paresthesias.   Physical Exam: There were no vitals taken for this visit. Wt Readings from Last 3 Encounters:  01/21/21 121 lb 6.4 oz (55.1 kg)   01/04/21 121 lb (54.9 kg)  12/10/20 121 lb 6 oz (55.1 kg)    General: Thin 31 year old male in NAD. Head: Normocephalic and atraumatic. Eyes: No scleral icterus. Conjunctiva pink . Ears: Normal auditory acuity. Mouth: Dentition intact. No ulcers or lesions.  Lungs: Clear throughout to auscultation. Heart: Regular rate and rhythm, no murmur. Abdomen: Soft, nondistended.  Mild central and LLQ tenderness without rebound or guarding.  No masses or hepatomegaly. Normal bowel sounds x 4 quadrants.  Rectal: Deferred.  Musculoskeletal: Symmetrical with no gross deformities. Extremities: No edema. Neurological: Alert oriented x 4. No focal deficits.  Psychological: Alert and cooperative. Normal mood and affect  Assessment and Recommendations:  1) Lower abdominal pain x 8 months, worse after eating -CTAP with oral and IV contrast -Consider diagnostic colonoscopy if CTAP unrevealing  2) Reported 23 pound weight loss in the past 8 months, secondary to # 1 -See plan in #1 -Ensure or boost 1 or 2 (eight ounce bottles daily)  3) Constipation likely due to decreased p.o. intake and side effect from Sucralfate -MiraLAX nightly as needed  4) Prior history of H. pylori.  EGD 11/14/2020 showed reactive gastropathy without evidence of H. pylori.  Further recommendations to be determined after CTAP results reviewed

## 2021-01-22 NOTE — Congregational Nurse Program (Signed)
°  Dept: [place] Nurse Program Note  Date of Encounter: 01/22/2021  Past Medical History: Past Medical History:  Diagnosis Date   Acid reflux disease    H. pylori infection     Encounter Details:  CNP Questionnaire - 01/22/21 1329       Questionnaire   Do you give verbal consent to treat you today? Yes    Location Patient [place] or Organization    Patient Status Refugee    Insurance Private or [place] Referral N/A    Medication Have Medication Insecurities    Medical Provider Yes    Screening Referrals N/A    Medical Referral N/A    Medical Appointment Made [facility] PCP/clinic    Food N/A    Transportation Provided transportation assistance    Housing/Utilities N/A    Interpersonal Safety N/A    Intervention [place];Advocate;Educate;Support    ED Visit Averted Yes    Life-Saving Intervention Made N/A            Client was provided transportation to [place] clinic appointment, to [facility] for prescriptions, and return home.  Toula Moos, CRNA Congregational and Community Health Nurse  Genie Karl Bales, RN, CN

## 2021-01-23 ENCOUNTER — Encounter: Payer: Self-pay | Admitting: *Deleted

## 2021-01-23 NOTE — Congregational Nurse Program (Signed)
°  Dept: [place] Nurse Program Note  Date of Encounter: 01/23/2021  Past Medical History: Past Medical History:  Diagnosis Date   Acid reflux disease    H. pylori infection     Encounter Details:  CNP Questionnaire - 01/23/21 1740       Questionnaire   Do you give verbal consent to treat you today? Yes    Location Patient [place] or Organization    Patient Status Refugee    Insurance Private or [place] Referral N/A    Medication Have Medication Insecurities    Medical Provider Yes    Screening Referrals N/A    Medical Referral N/A    Medical Appointment Made [facility] PCP/clinic    Food N/A    Transportation Need transportation assistance    Housing/Utilities N/A    Interpersonal Safety N/A    Intervention [place];Advocate;Educate;Support    ED Visit Averted Yes    Life-Saving Intervention Made N/A            Client has a CT scan ordered for 0800 on 02/01/21.  Client called this CN to request transportation assistance for the CT scan.  This CN will assure that transportation is provided to client to [facility] hospital for the CT scan.  Will follow up as needed.    Karene Fry, RN, MSN, [place] Office [phone] Cell

## 2021-01-23 NOTE — Progress Notes (Signed)
Agree with assessment and plan as outlined.  

## 2021-01-30 ENCOUNTER — Other Ambulatory Visit: Payer: Self-pay | Admitting: Nurse Practitioner

## 2021-01-30 DIAGNOSIS — R1013 Epigastric pain: Secondary | ICD-10-CM

## 2021-01-30 DIAGNOSIS — R1084 Generalized abdominal pain: Secondary | ICD-10-CM

## 2021-01-30 DIAGNOSIS — K219 Gastro-esophageal reflux disease without esophagitis: Secondary | ICD-10-CM

## 2021-02-01 ENCOUNTER — Ambulatory Visit (HOSPITAL_COMMUNITY)
Admission: RE | Admit: 2021-02-01 | Discharge: 2021-02-01 | Disposition: A | Discharge status: Home / Self Care | Payer: BC Managed Care – PPO | Source: Ambulatory Visit | Attending: Nurse Practitioner | Admitting: Nurse Practitioner

## 2021-02-01 ENCOUNTER — Other Ambulatory Visit: Payer: Self-pay

## 2021-02-01 ENCOUNTER — Encounter (HOSPITAL_COMMUNITY): Payer: Self-pay

## 2021-02-01 DIAGNOSIS — R634 Abnormal weight loss: Secondary | ICD-10-CM | POA: Diagnosis present

## 2021-02-01 DIAGNOSIS — K5900 Constipation, unspecified: Secondary | ICD-10-CM | POA: Insufficient documentation

## 2021-02-01 DIAGNOSIS — R1030 Lower abdominal pain, unspecified: Secondary | ICD-10-CM | POA: Insufficient documentation

## 2021-02-01 MED ORDER — SODIUM CHLORIDE (PF) 0.9 % IJ SOLN
INTRAMUSCULAR | Status: AC
  Filled 2021-02-01: qty 50

## 2021-02-01 MED ORDER — IOHEXOL 300 MG/ML  SOLN
80.0000 mL | Freq: Once | INTRAMUSCULAR | Status: AC | PRN
  Administered 2021-02-01: 80 mL via INTRAVENOUS

## 2021-02-06 ENCOUNTER — Other Ambulatory Visit: Payer: Self-pay

## 2021-02-06 MED ORDER — DICYCLOMINE HCL 10 MG PO CAPS: 10.0000 mg | ORAL_CAPSULE | Freq: Three times a day (TID) | ORAL | 1 refills | Status: DC | PRN

## 2021-02-23 ENCOUNTER — Encounter: Payer: Self-pay | Admitting: *Deleted

## 2021-02-23 NOTE — Congregational Nurse Program (Signed)
°  Dept: [phone]   Congregational Nurse Program Note  Date of Encounter: 02/23/2021  Past Medical History: Past Medical History:  Diagnosis Date   Acid reflux disease    H. pylori infection     Encounter Details:  CNP Questionnaire - 02/23/21 1400       Questionnaire   Do you give verbal consent to treat you today? Yes    Location Patient [place] or Organization    Patient Status Refugee    Insurance Private or [place] Referral N/A    Medication Have Medication Insecurities    Medical Provider Yes    Screening Referrals N/A    Medical Referral N/A    Medical Appointment Made N/A    Food N/A    Transportation Need transportation assistance    Housing/Utilities N/A    Interpersonal Safety N/A    Intervention [place];Advocate;Educate;Support    ED Visit Averted Yes    Life-Saving Intervention Made N/A            Client see at his home by this CN for follow up.  Client reports ongoing constipation problems.  Has tried miralax with little effect.  Client also worried about some medical bills and letters from [facility].  Assisted with making payments for client through his bank card.  Explained deductibles and co-insurance to client.  Will follow up with client as needed.    Karene Fry, RN, MSN, [place] Office [phone] Cell

## 2021-03-11 ENCOUNTER — Encounter: Payer: Self-pay | Admitting: *Deleted

## 2021-03-11 ENCOUNTER — Ambulatory Visit: Payer: BC Managed Care – PPO | Admitting: Gastroenterology

## 2021-03-11 NOTE — Congregational Nurse Program (Signed)
?  Dept: [phone] ? ? ?Congregational Nurse Program Note ? ?Date of Encounter: 03/11/2021 ? ?Past Medical History: ?Past Medical History:  ?Diagnosis Date  ? Acid reflux disease   ? H. pylori infection   ? ? ?Encounter Details: ? CNP Questionnaire - 03/11/21 0845   ? ?  ? Questionnaire  ? Do you give verbal consent to treat you today? Yes   ? Location Patient [place]   ? Visit Setting Phone/Text/Email   ? Patient Status Refugee   ? Sport and exercise psychologist or [employer]   ? Insurance Referral N/A   ? Medication Have Medication Insecurities   ? Medical Provider Yes   ? Screening Referrals N/A   ? Medical Referral N/A   ? Medical Appointment Made Other   ? Food N/A   ? Transportation Need transportation assistance   ? Housing/Utilities N/A   ? Interpersonal Safety N/A   ? Intervention [employer];Advocate;Educate;Support   ? ED Visit Averted Yes   ? Life-Saving Intervention Made N/A   ? ?  ?  ? ?  ?Client asked this CN to reschedule his appointment with [facility].  Called and rescheduled appt for April 21 at 11 am.  Will assure that transportation is provided for Client to this appointment.  Communicated appointment change to client.  Will follow up with client on 03/13/21.   ? ?Karene Fry, RN, MSN, CNP ?[phone] Office ?[phone] Cell ? ? ? ? ?

## 2021-03-13 ENCOUNTER — Encounter: Payer: Self-pay | Admitting: *Deleted

## 2021-03-13 NOTE — Congregational Nurse Program (Signed)
?  Dept: [phone] ? ? ?Congregational Nurse Program Note ? ?Date of Encounter: 03/13/2021 ? ?Past Medical History: ?Past Medical History:  ?Diagnosis Date  ? Acid reflux disease   ? H. pylori infection   ? ? ?Encounter Details: ? CNP Questionnaire - 03/13/21 1306   ? ?  ? Questionnaire  ? Do you give verbal consent to treat you today? Yes   ? Location Patient [place]   ? Visit Setting Home   ? Patient Status Refugee   ? Sport and exercise psychologist or [employer]   ? Insurance Referral N/A   ? Medication Have Medication Insecurities   ? Medical Provider Yes   ? Screening Referrals N/A   ? Medical Referral N/A   ? Medical Appointment Made N/A   ? Food N/A   ? Transportation Need transportation assistance   ? Housing/Utilities N/A   ? Interpersonal Safety N/A   ? Intervention [employer];Advocate;Educate;Support;Counsel   ? ED Visit Averted Yes   ? Life-Saving Intervention Made N/A   ? ?  ?  ? ?  ? ?Visited client in his home.  Assisted to call [facility], Radiology and [facility] physicians to set up payment for several bills that client had received.  Did set up monthly payments for all current bills.  Client tells me that he has chest congestion at times and has allergies.  He tells me that he will let me know when he gets a "chest infection" and we will then set up a PCP appointment.  Will follow up with this CN as needed. ? ?Karene Fry, RN, MSN, CNP ?[phone] Office ?[phone] Cell ? ? ? ?

## 2021-04-26 ENCOUNTER — Encounter: Payer: Self-pay | Admitting: Gastroenterology

## 2021-04-26 ENCOUNTER — Ambulatory Visit (INDEPENDENT_AMBULATORY_CARE_PROVIDER_SITE_OTHER): Payer: BC Managed Care – PPO | Admitting: Gastroenterology

## 2021-04-26 VITALS — BP 100/60 | HR 56 | Ht 66.0 in | Wt 125.0 lb

## 2021-04-26 DIAGNOSIS — R1013 Epigastric pain: Secondary | ICD-10-CM

## 2021-04-26 DIAGNOSIS — J45909 Unspecified asthma, uncomplicated: Secondary | ICD-10-CM

## 2021-04-26 DIAGNOSIS — G8929 Other chronic pain: Secondary | ICD-10-CM

## 2021-04-26 DIAGNOSIS — R1084 Generalized abdominal pain: Secondary | ICD-10-CM | POA: Diagnosis not present

## 2021-04-26 DIAGNOSIS — K5900 Constipation, unspecified: Secondary | ICD-10-CM

## 2021-04-26 DIAGNOSIS — K219 Gastro-esophageal reflux disease without esophagitis: Secondary | ICD-10-CM | POA: Diagnosis not present

## 2021-04-26 MED ORDER — LINACLOTIDE 145 MCG PO CAPS: 145.0000 ug | ORAL_CAPSULE | Freq: Every day | ORAL | 0 refills | Status: DC

## 2021-04-26 MED ORDER — FLUTICASONE-SALMETEROL 250-50 MCG/ACT IN AEPB: 1.0000 | INHALATION_SPRAY | Freq: Two times a day (BID) | RESPIRATORY_TRACT | 0 refills | Status: DC

## 2021-04-26 MED ORDER — POLYETHYLENE GLYCOL 3350 17 GM/SCOOP PO POWD: ORAL | 3 refills | Status: DC

## 2021-04-26 MED ORDER — PANTOPRAZOLE SODIUM 40 MG PO TBEC: 40.0000 mg | DELAYED_RELEASE_TABLET | Freq: Two times a day (BID) | ORAL | 2 refills | Status: DC

## 2021-04-26 MED ORDER — FDGARD 25-20.75 MG PO CAPS: ORAL_CAPSULE | ORAL | 0 refills | Status: DC

## 2021-04-26 NOTE — Patient Instructions (Addendum)
Take Miralax 17 Gm in 8 oz of water 2 times a day for your constipation??  ?We have given you samples of the following medication to take: ?Linzess 145 mcg- take one tablet by mouth daily.  Call for a prescription if this helps your constipation ?Fdgard- take one tablet by mouth 30-60 minutes before your meals. Can be purchased over the counter if this helps your dyspepsia ? ?Please increase your pantoprazole to two times a day ?Dr. Havery Moros has provided you a courtesy refill of your Advair.  Please contact your primary care doctor for additional refills.  Dr. Havery Moros will not be able to continue to refill for you.  ? ? ? ?

## 2021-04-26 NOTE — Progress Notes (Signed)
? ?HPI :  ?31 year old [place] male, non-English speaking, with a history of H. Pylori infection, here for a follow-up visit for GERD, epigastric discomfort, constipation, bloating. ? ?Translator accompanies the patient today to provide history. ? ?Recall he has undergone an evaluation with a EGD in November, he was negative for H. pylori, had a small hiatal hernia.  Tested negative for celiac disease.  Has been on some PPI over time, he has transition from omeprazole to pantoprazole, currently on Protonix 40 mg once daily.  He complained of weight loss and abdominal pain at his last visit with Carl Best, he had a CT scan abdomen pelvis done in January which was normal without any concerning pathology.  He has had labs done without any concerning abnormalities to date. ? ?He has a variety of complaints today.  He is taking Protonix 40 mg once daily and states that is helping certainly to control his heartburn and he feels like it does benefit him, however he continues to have some heartburn around mealtime.  He also has some epigastric burning that bothers him.  He states he feels this most when he is hungry, but then gets a sense of fullness in his upper abdomen and bloating after he eats.  He has gained a few pounds since his last CNS, has not lost any further weight.  Eating okay.  He denies any dysphagia.  He previously was on some Carafate which helps to some extent. ? ?Otherwise he continues to have constipation that bothers him.  Will have a bowel movement perhaps every 3 days ago, states it is very hard to go, his stomach feels distended and bloated and he has a hard time feeling fully evacuated after he moves his bowels.  He was instructed to take MiraLAX daily, states at that dose it did not really help but only took it for about a week or so.  Has been using Dulcolax it sounds like over-the-counter without much help.  He is inquiring about other options for that. ? ?He is asking for  refill of his Advair today for asthma.  Also inquires about musculoskeletal complaints in his arms and legs. ? ?  ?EGD 11/14/2020: ?Esophagogastric landmarks identified. ?- 2 cm hiatal hernia. ?- Normal esophagus otherwise ?- A single gastric polyp. Resected and retrieved. ?- Normal stomach otherwise - biopsies taken to rule out H pylori ?- Normal duodenal bulb and second portion of the duodenum. Biopsied ?Path Report: ?1. Surgical [P], duodenal ?FRAGMENTS NORMAL DUODENAL MUCOSA. ?THERE ARE NO DIAGNOSTIC FEATURES OF CELIAC DISEASE. ?2. Surgical [P], gastric ?FRAGMENTS OF GASTRIC MUCOSA WITH MILD NONSPECIFIC INFLAMMATION MINIMAL VASCULAR ECTASIA ?CONSISTENT WITH REACTIVE GASTROPATHY. ?H. PYLORI AND INTESTINAL METAPLASIA ARE NOT IDENTIFIED. ?3. Surgical [P], gastric polyp ?BENIGN HYPERPLASTIC GASTRIC POLYP. ?NEGATIVE FOR DYSPLASIA. ? ?CT abdomen / pelvis 02/02/21: ?IMPRESSION: ?1. No acute abdominopelvic findings on this study degraded by ?motion. ?2. Moderate volume of formed stool throughout the colon suggestive ?of constipation. ? ? ?Past Medical History:  ?Diagnosis Date  ? Acid reflux disease   ? Constipation   ? Dyspepsia   ? H. pylori infection   ? ? ? ?Past Surgical History:  ?Procedure Laterality Date  ? NO PAST SURGERIES    ? ?Family History  ?Family history unknown: Yes  ? ?Social History  ? ?Tobacco Use  ? Smoking status: Never  ? Smokeless tobacco: Never  ?Vaping Use  ? Vaping Use: Never used  ?Substance Use Topics  ? Alcohol use: Not Currently  ?  Drug use: Not Currently  ? ?Current Outpatient Medications  ?Medication Sig Dispense Refill  ? Caraway Oil-Levomenthol (FDGARD) 25-20.75 MG CAPS Take one by mouth 30-60 minutes before meals ? ?Lot # [id number] exp 11/2022 12 capsule 0  ? linaclotide (LINZESS) 145 MCG CAPS capsule Take 1 capsule (145 mcg total) by mouth daily before breakfast. Medication Samples have been provided to the patient. ? ?Drug name:Linzess       Strength: 145     Qty: 12 LOT:  [id number] Exp.Date: 06/2021 ? ?Dosing instructions: take on tablet by mouth daily ? ?The patient has been instructed regarding the correct time, dose, and frequency of taking this medication, including desired effects and most common side effects.  ? ?Barb Merino ?11:43 AM ?04/26/2021 12 capsule 0  ? Magnesium 100 MG CAPS Take by mouth.    ? montelukast (SINGULAIR) 10 MG tablet Take 10 mg by mouth daily.    ? polyethylene glycol powder (GLYCOLAX/MIRALAX) 17 GM/SCOOP powder Take 17 gm by mouth two times a day 255 g 3  ? fluticasone-salmeterol (ADVAIR DISKUS) 250-50 MCG/ACT AEPB Inhale 1 puff into the lungs in the morning and at bedtime. 1 each 0  ? pantoprazole (PROTONIX) 40 MG tablet Take 1 tablet (40 mg total) by mouth 2 (two) times daily. 60 tablet 2  ? ?No current facility-administered medications for this visit.  ? ?No Known Allergies ? ? ?Review of Systems: ?All systems reviewed and negative except where noted in HPI.  ? ? ?Lab Results  ?Component Value Date  ? WBC 6.0 01/04/2021  ? HGB 16.3 01/04/2021  ? HCT 46.6 01/04/2021  ? MCV 79 01/04/2021  ? PLT 241 01/04/2021  ? ? ?Lab Results  ?Component Value Date  ? CREATININE 0.89 01/04/2021  ? BUN 21 (H) 01/04/2021  ? NA 141 01/04/2021  ? K 4.2 01/04/2021  ? CL 100 01/04/2021  ? CO2 24 01/04/2021  ? ? ?Lab Results  ?Component Value Date  ? ALT 17 01/04/2021  ? AST 24 01/04/2021  ? ALKPHOS 56 01/04/2021  ? BILITOT 1.1 01/04/2021  ? ? ? ?Physical Exam: ?BP 100/60   Pulse (!) 56   Ht '5\' 6"'$  (1.676 m)   Wt 125 lb (56.7 kg)   SpO2 99%   BMI 20.18 kg/m?  ?Constitutional: Pleasant,well-developed, male in no acute distress. ?Neurological: Alert and oriented to person place and time. ?Psychiatric: Normal mood and affect. Behavior is normal. ? ? ?ASSESSMENT AND PLAN: ?31 year old male here for reassessment of the following: ? ?GERD ?Dyspepsia ?Constipation ?Bloating ?Asthma ? ?Patient with multiple upper tract symptoms.  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  He does respond to PPI but still having some dyspepsia that bothers him.  I discussed options with him.  He tested negative for H. pylori, CT scan looks okay, he does have a small hiatal hernia which could normal reflux.  Recommend trial of Protonix 40 mg twice daily for the next month to see if that provides any further benefit.  If it does he can titrate to lowest dose needed to control symptoms long-term.  I also will treat him for dyspepsia with some FD guard samples we had in the office will see if that helps.  If it does provide benefit he can get it over-the-counter.  If his symptoms persist despite these measures we may consider an empiric trial of Reglan or consider work-up for gastroparesis. ? ?Constipation I think is  driving a lot of his abdominal bloating and gas.  MiraLAX at low-dose does not appear to be working too well although he has not taken it for too long.  I recommend higher dose MiraLAX taken every day initially to see if that helps as it is safe and probably cheapest option for him.  He will try twice daily MiraLAX for a few weeks and see if it helps.  He inquires about other options and I gave him some samples of Linzess 145 mcg, counseled him to take daily as needed, risks include abdominal cramping and diarrhea.  If he wants a prescription for this he will contact us ? ?I reassured him of his work-up today otherwise given results of EGD, CT scan, and labs - I don't think we are missing anything bad.  I will refill his Advair 1 time but he needs to see his primary care for further refills.  I also recommend he see his primary care about his variety of musculoskeletal complaints as well.  He agreed ? ?Can follow-up as needed with [place] if symptoms persist. ? ?Jolly Mango, MD ?[facility] ? ?

## 2021-05-15 ENCOUNTER — Encounter: Payer: Self-pay | Admitting: *Deleted

## 2021-05-15 NOTE — Congregational Nurse Program (Signed)
?  Dept: [phone] ? ? ?Congregational Nurse Program Note ? ?Date of Encounter: 05/15/2021 ? ?Past Medical History: ?Past Medical History:  ?Diagnosis Date  ? Acid reflux disease   ? Constipation   ? Dyspepsia   ? H. pylori infection   ? ? ?Encounter Details: ? CNP Questionnaire - 05/15/21 1456   ? ?  ? Questionnaire  ? Do you give verbal consent to treat you today? Yes   ? Location Patient [place]   ? Visit Setting Phone/Text/Email   ? Patient Status Refugee   ? Sport and exercise psychologist or [employer]   ? Insurance Referral N/A   ? Medication Have Medication Insecurities   ? Medical Provider Yes   ? Screening Referrals N/A   ? Medical Referral N/A   ? Medical Appointment Made Other   ? Food N/A   ? Transportation Need transportation assistance   ? Housing/Utilities N/A   ? Interpersonal Safety N/A   ? Intervention [employer];Advocate;Educate;Support;Counsel   ? ED Visit Averted Yes   ? Life-Saving Intervention Made N/A   ? ?  ?  ? ?  ? ? ? ? ?Dept: [phone] ? ? ?Congregational Nurse Program Note ? ?Date of Encounter: 05/15/2021 ? ?Past Medical History: ?Past Medical History:  ?Diagnosis Date  ? Acid reflux disease   ? Constipation   ? Dyspepsia   ? H. pylori infection   ? ? ?Encounter Details: ? CNP Questionnaire - 05/15/21 1456   ? ?  ? Questionnaire  ? Do you give verbal consent to treat you today? Yes   ? Location Patient [place]   ? Visit Setting Phone/Text/Email   ? Patient Status Refugee   ? Sport and exercise psychologist or [employer]   ? Insurance Referral N/A   ? Medication Have Medication Insecurities   ? Medical Provider Yes   ? Screening Referrals N/A   ? Medical Referral N/A   ? Medical Appointment Made Other   ? Food N/A   ? Transportation Need transportation assistance   ? Housing/Utilities N/A   ? Interpersonal Safety N/A   ? Intervention [employer];Advocate;Educate;Support;Counsel   ? ED Visit Averted Yes   ? Life-Saving Intervention Made N/A   ? ?  ?  ? ?   ? ?Received call from client stating that he thinks he may have Typhoid and wants me to make an MD appointment for him.  He has an appointment on 06/10/21, but he would like an earlier appointment.  I called the MD office and left a message.  I will try to change the appointment and then will follow up with client. ? ?Karene Fry, RN, MSN, CNP ?[phone] Office ?[phone] Cell ? ? ? ? ?

## 2021-05-15 NOTE — Congregational Nurse Program (Signed)
?  Dept: [phone] ? ? ?Congregational Nurse Program Note ? ?Date of Encounter: 05/15/2021 ? ?Past Medical History: ?Past Medical History:  ?Diagnosis Date  ? Acid reflux disease   ? Constipation   ? Dyspepsia   ? H. pylori infection   ? ? ?Encounter Details: ? CNP Questionnaire - 05/15/21 1734   ? ?  ? Questionnaire  ? Do you give verbal consent to treat you today? Yes   ? Location Patient [place]   ? Visit Setting Phone/Text/Email   ? Patient Status Refugee   ? Sport and exercise psychologist or [employer]   ? Insurance Referral N/A   ? Medication Have Medication Insecurities   ? Medical Provider Yes   ? Screening Referrals N/A   ? Medical Referral N/A   ? Medical Appointment Made [facility] PCP/clinic   ? Food N/A   ? Transportation Need transportation assistance   ? Housing/Utilities N/A   ? Interpersonal Safety N/A   ? Intervention [employer];Advocate;Educate;Support;Counsel   ? ED Visit Averted Yes   ? Life-Saving Intervention Made N/A   ? ?  ?  ? ?  ? ?Per client request, appointment has been rescheduled from June 5 to May 17th at 11:00 am.  I have notified the client by text.  I will assure that transportation is provided to appointment for the May 17 appointment.  Will follow up as needed. ? ?Karene Fry, RN, MSN, CNP ?[phone] Office ?[phone] Cell ? ? ? ?

## 2021-05-22 ENCOUNTER — Encounter: Payer: Self-pay | Admitting: Nurse Practitioner

## 2021-05-22 ENCOUNTER — Ambulatory Visit (INDEPENDENT_AMBULATORY_CARE_PROVIDER_SITE_OTHER): Payer: BC Managed Care – PPO | Admitting: Nurse Practitioner

## 2021-05-22 ENCOUNTER — Encounter: Payer: Self-pay | Admitting: *Deleted

## 2021-05-22 VITALS — BP 127/95 | HR 62 | Temp 98.2°F | Ht 66.0 in | Wt 126.0 lb

## 2021-05-22 DIAGNOSIS — R519 Headache, unspecified: Secondary | ICD-10-CM | POA: Diagnosis not present

## 2021-05-22 DIAGNOSIS — F419 Anxiety disorder, unspecified: Secondary | ICD-10-CM

## 2021-05-22 DIAGNOSIS — Z8619 Personal history of other infectious and parasitic diseases: Secondary | ICD-10-CM

## 2021-05-22 DIAGNOSIS — F3289 Other specified depressive episodes: Secondary | ICD-10-CM

## 2021-05-22 DIAGNOSIS — M25511 Pain in right shoulder: Secondary | ICD-10-CM

## 2021-05-22 DIAGNOSIS — M542 Cervicalgia: Secondary | ICD-10-CM | POA: Diagnosis not present

## 2021-05-22 DIAGNOSIS — M25512 Pain in left shoulder: Secondary | ICD-10-CM

## 2021-05-22 DIAGNOSIS — H5310 Unspecified subjective visual disturbances: Secondary | ICD-10-CM | POA: Diagnosis not present

## 2021-05-22 DIAGNOSIS — G8929 Other chronic pain: Secondary | ICD-10-CM

## 2021-05-22 MED ORDER — ACETAMINOPHEN 500 MG PO TABS: 500.0000 mg | ORAL_TABLET | Freq: Four times a day (QID) | ORAL | 0 refills | Status: DC | PRN

## 2021-05-22 MED ORDER — CYCLOBENZAPRINE HCL 10 MG PO TABS: 10.0000 mg | ORAL_TABLET | Freq: Three times a day (TID) | ORAL | 0 refills | Status: DC | PRN

## 2021-05-22 NOTE — Congregational Nurse Program (Signed)
  Dept: [phone]   Congregational Nurse Program Note  Date of Encounter: 05/22/2021  Past Medical History: Past Medical History:  Diagnosis Date   Acid reflux disease    Constipation    Dyspepsia    H. pylori infection     Encounter Details:  CNP Questionnaire - 05/22/21 1637       Questionnaire   Do you give verbal consent to treat you today? Yes    Location Patient [place]    Visit Setting Phone/Text/Email    Patient Status Refugee    Insurance Private or [place] Referral N/A    Medication Have Medication Insecurities    Medical Provider Yes    Screening Referrals N/A    Medical Referral Other    Medical Appointment [place];Other;[facility] virtual visit    Food N/A    Transportation Need transportation assistance    Housing/Utilities N/A    Chiropractor N/A    Intervention [place];Advocate;Educate;Support;Counsel    ED Visit Averted Yes    Life-Saving Intervention Made N/A            Called on behalf of client and made appointments for May 29 898 at infectious disease and virtual appointment made for August 17 at 58 am with [place].  I have texted client to notify him of appointments.  I will assist with transportation to and from appointments as needed.  Karene Fry, RN, MSN, [place] Office [phone] Cell

## 2021-05-22 NOTE — Congregational Nurse Program (Signed)
?  Dept: [phone] ? ? ?Congregational Nurse Program Note ? ?Date of Encounter: 05/22/2021 ? ?Past Medical History: ?Past Medical History:  ?Diagnosis Date  ? Acid reflux disease   ? Constipation   ? Dyspepsia   ? H. pylori infection   ? ? ?Encounter Details: ? CNP Questionnaire - 05/22/21 1100   ? ?  ? Questionnaire  ? Do you give verbal consent to treat you today? Yes   ? Location Patient [place]   ? Visit Setting Home;MD Office   ? Patient Status Refugee   ? Sport and exercise psychologist or [employer]   ? Insurance Referral N/A   ? Medication Have Medication Insecurities   ? Medical Provider Yes   ? Screening Referrals N/A   ? Medical Referral N/A   ? Medical Appointment Made Other   ? Food N/A   ? Transportation Need transportation assistance;Provided transportation assistance   ? Housing/Utilities N/A   ? Interpersonal Safety N/A   ? Intervention [employer];Advocate;Educate;Support;Counsel   ? ED Visit Averted Yes   ? Life-Saving Intervention Made N/A   ? ?  ?  ? ?  ? ?Client was provided with transportation assistance to PCP appointment.  Client has been referred to several additional doctors for follow up.  Appointment was made with [facility] for June 13 at 0920.  Transportation home was provided.  This CN will call and make additional appointments with Behavioral Health and with infectious disease. ? ?Karene Fry, RN, MSN, CNP ?[phone] Office ?[phone] Cell ? ? ? ? ? ?

## 2021-05-22 NOTE — Progress Notes (Signed)
[place], [place]  [address] Phone:  [phone]   Fax:  [phone] Subjective:   Patient ID: Eric Mcbride, male    DOB: 09/20/1990, 31 y.o.   MRN: [id number]  Chief Complaint  Patient presents with   Follow-up    Patient is here today with language services to discuss shoulders, neck and head pain x 1 month. Patient states that he has been having headaches associated with dizziness and lightheadedness.    HPI Eric Mcbride 31 y.o. male  has a past medical history of Acid reflux disease, Constipation, Dyspepsia, and H. pylori infection. To the [facility] for lightheadedness, musculoskeletal pain, headaches, anxiety and depression  Patient states that he has had pain in the lower neck and bilateral shoulders for several months, was treated with improvement of symptoms, however; symptoms returned 1 mth ago. Has not taken any medication for symptoms. Also concerned about headaches that occur while studying online for reading for long periods, has not taken any medications or received treatment for symptoms. Headaches typically occurs in the frontal portion of head and bilateral temples.   Also concerned today about possible anxiety and depression, requesting referral for counseling. Patient would also like to be screened for typhoid, states that he was diagnosed with it two years ago, with similar symptoms to what he has today. Denies any other concerns today.   Denies any fatigue, chest pain, shortness of breath, HA or dizziness. Denies any blurred vision, numbness or tingling.  Visit completed with assistance of in-person interpreter  Past Medical History:  Diagnosis Date   Acid reflux disease    Constipation    Dyspepsia    H. pylori infection     Past Surgical History:  Procedure Laterality Date   NO PAST SURGERIES      Family History  Family history unknown: Yes    Social History   Socioeconomic History   Marital  status: Married    Spouse name: Not on file   Number of children: 1   Years of education: Not on file   Highest education level: Not on file  Occupational History   Occupation: [employer]  Tobacco Use   Smoking status: Never   Smokeless tobacco: Never  Vaping Use   Vaping Use: Never used  Substance and Sexual Activity   Alcohol use: Not Currently   Drug use: Not Currently   Sexual activity: Yes    Birth control/protection: None  Other Topics Concern   Not on file  Social History Narrative   Not on file   Social Determinants of Health   Financial Resource Strain: Not on file  Food Insecurity: Not on file  Transportation Needs: Not on file  Physical Activity: Not on file  Stress: Not on file  Social Connections: Not on file  Intimate Partner Violence: Not on file    Outpatient Medications Prior to Visit  Medication Sig Dispense Refill   fluticasone-salmeterol (ADVAIR DISKUS) 250-50 MCG/ACT AEPB Inhale 1 puff into the lungs in the morning and at bedtime. 1 each 0   linaclotide (LINZESS) 145 MCG CAPS capsule Take 1 capsule (145 mcg total) by mouth daily before breakfast. Medication Samples have been provided to the patient.  Drug name:Linzess       Strength: 145     Qty: 12 LOT: [id number] Exp.Date: 06/2021  Dosing instructions: take on tablet by mouth daily  The patient has been instructed regarding the correct time, dose, and frequency of  taking this medication, including desired effects and most common side effects.   Barb Merino 11:43 AM 04/26/2021 12 capsule 0   montelukast (SINGULAIR) 10 MG tablet Take 10 mg by mouth daily.     pantoprazole (PROTONIX) 40 MG tablet Take 1 tablet (40 mg total) by mouth 2 (two) times daily. 60 tablet 2   polyethylene glycol powder (GLYCOLAX/MIRALAX) 17 GM/SCOOP powder Take 17 gm by mouth two times a day 255 g 3   Caraway Oil-Levomenthol (FDGARD) 25-20.75 MG CAPS Take one by mouth 30-60 minutes before meals  Lot # [id number] exp 11/2022  (Patient not taking: Reported on 05/22/2021) 12 capsule 0   Magnesium 100 MG CAPS Take by mouth. (Patient not taking: Reported on 05/22/2021)     No facility-administered medications prior to visit.    No Known Allergies  Review of Systems  Constitutional:  Negative for chills, fever and malaise/fatigue.  HENT: Negative.    Eyes: Negative.   Respiratory:  Negative for cough and shortness of breath.   Cardiovascular:  Negative for chest pain, palpitations and leg swelling.  Gastrointestinal:  Positive for abdominal pain. Negative for blood in stool, constipation, diarrhea, nausea and vomiting.       Patient endorses continued abdominal symptoms, which is currently being managed by GI  Musculoskeletal:  Positive for joint pain and neck pain.  Skin: Negative.   Neurological:  Positive for headaches.  Psychiatric/Behavioral:  Positive for depression. The patient is nervous/anxious.   All other systems reviewed and are negative.     Objective:    Physical Exam Constitutional:      General: He is not in acute distress.    Appearance: Normal appearance. He is normal weight.  HENT:     Head: Normocephalic.     Right Ear: Tympanic membrane, ear canal and external ear normal. There is no impacted cerumen.     Left Ear: Tympanic membrane, ear canal and external ear normal. There is no impacted cerumen.     Nose: Nose normal. No congestion or rhinorrhea.  Eyes:     General: No scleral icterus.       Right eye: No discharge.        Left eye: No discharge.     Extraocular Movements: Extraocular movements intact.     Conjunctiva/sclera: Conjunctivae normal.     Pupils: Pupils are equal, round, and reactive to light.  Neck:     Vascular: No carotid bruit.  Cardiovascular:     Rate and Rhythm: Normal rate and regular rhythm.     Pulses: Normal pulses.     Heart sounds: Normal heart sounds.     Comments: No obvious peripheral edema Pulmonary:     Effort: Pulmonary effort is normal.      Breath sounds: Normal breath sounds.  Abdominal:     General: Abdomen is flat. Bowel sounds are normal. There is no distension.     Palpations: Abdomen is soft. There is no mass.     Tenderness: There is no abdominal tenderness. There is no guarding or rebound.  Musculoskeletal:        General: No swelling, tenderness, deformity or signs of injury. Normal range of motion.     Cervical back: Normal range of motion and neck supple. No rigidity or tenderness.     Right lower leg: No edema.     Left lower leg: No edema.  Lymphadenopathy:     Cervical: No cervical adenopathy.  Skin:    General: Skin is  warm and dry.     Capillary Refill: Capillary refill takes less than 2 seconds.  Neurological:     General: No focal deficit present.     Mental Status: He is alert and oriented to person, place, and time.  Psychiatric:        Mood and Affect: Mood normal.        Behavior: Behavior normal.        Thought Content: Thought content normal.        Judgment: Judgment normal.    BP (!) 127/95   Pulse 62   Temp 98.2 F (36.8 C)   Ht '5\' 6"'  (1.676 m)   Wt 126 lb (57.2 kg)   SpO2 99%   BMI 20.34 kg/m  Wt Readings from Last 3 Encounters:  05/22/21 126 lb (57.2 kg)  04/26/21 125 lb (56.7 kg)  01/21/21 121 lb 6.4 oz (55.1 kg)    Immunization History  Administered Date(s) Administered   Influenza,inj,Quad PF,6+ Mos 10/19/2020   Janssen (J&J) SARS-COV-2 Vaccination 09/27/2019   PFIZER(Purple Top)SARS-COV-2 Vaccination 01/28/2020    Diabetic Foot Exam - Simple   No data filed     Lab Results  Component Value Date   TSH 1.130 10/01/2020   Lab Results  Component Value Date   WBC 6.0 01/04/2021   HGB 16.3 01/04/2021   HCT 46.6 01/04/2021   MCV 79 01/04/2021   PLT 241 01/04/2021   Lab Results  Component Value Date   NA 141 01/04/2021   K 4.2 01/04/2021   CO2 24 01/04/2021   GLUCOSE 79 01/04/2021   BUN 21 (H) 01/04/2021   CREATININE 0.89 01/04/2021   BILITOT 1.1 01/04/2021    ALKPHOS 56 01/04/2021   AST 24 01/04/2021   ALT 17 01/04/2021   PROT 7.0 01/04/2021   ALBUMIN 4.9 01/04/2021   CALCIUM 9.9 01/04/2021   EGFR 120 01/04/2021   No results found for: CHOL No results found for: HDL No results found for: LDLCALC No results found for: TRIG No results found for: CHOLHDL No results found for: HGBA1C     Assessment & Plan:   Problem List Items Addressed This Visit   None Visit Diagnoses     Chronic pain of both shoulders    -  Primary   Relevant Medications   acetaminophen (TYLENOL) 500 MG tablet   cyclobenzaprine (FLEXERIL) 10 MG tablet   Neck pain       Relevant Medications   acetaminophen (TYLENOL) 500 MG tablet   cyclobenzaprine (FLEXERIL) 10 MG tablet   Nonintractable headache, unspecified chronicity pattern, unspecified headache type       Relevant Medications   acetaminophen (TYLENOL) 500 MG tablet   cyclobenzaprine (FLEXERIL) 10 MG tablet Medications initiated during visit Discussed non pharmacological methods for management of symptoms Informed to take OTC medications as needed    Eye strain, bilateral     Given list of local eye doctors    Anxiety       Relevant Orders   Ambulatory referral to Psychiatry Discussed non pharmacological methods for management of symptoms Informed to take OTC medications as needed    Other depression       Relevant Orders   Ambulatory referral to Psychiatry Discussed non pharmacological methods for management of symptoms Informed to take OTC medications as needed    Hx of typhoid fever       Relevant Orders   Ambulatory referral to Infectious Disease Low suspicion of typhoid fever, given patient benign  exam and acute HPI, referral completed for evaluation at his request    Maintain upcoming follow up with PCP, sooner as needed    I am having Eric Mcbride start on acetaminophen and cyclobenzaprine. I am also having him maintain his montelukast, Magnesium, pantoprazole,  fluticasone-salmeterol, linaclotide, FDgard, and polyethylene glycol powder.  Meds ordered this encounter  Medications   acetaminophen (TYLENOL) 500 MG tablet    Sig: Take 1 tablet (500 mg total) by mouth every 6 (six) hours as needed.    Dispense:  30 tablet    Refill:  0   cyclobenzaprine (FLEXERIL) 10 MG tablet    Sig: Take 1 tablet (10 mg total) by mouth 3 (three) times daily as needed for muscle spasms.    Dispense:  30 tablet    Refill:  0     Teena Dunk, NP

## 2021-05-22 NOTE — Patient Instructions (Addendum)
You were seen today in the [facility] for evaluation of multiple complaints. You were prescribed medications, please take as directed. Please follow up as needed  ? ?Eye Doctors That Accept Medicaid and/or Medicare ? ?[facility] ?[address],  ?[place], [place] [address] ?[phone] ?[email or web address]  ? ?[facility] ?[place], [place] [address] ?[phone] ?[email or web address]  ? ?[facility] ?[address], [place] [address] ?Located next to [address] ?Phone: [phone] ?[facility] ?[address] ?[place], [place] [address] ?Located next to [address] ?Phone: [phone] ?[email or web address]  ? ?[facility] ?[address] ?[place], [place] [address] ?[phone] ?[email or web address]  ? ?[facility] ?[address] ?[place], [place] [address] ?[phone] ?[email or web address]  ? ?[facility] ?[address]  ?[place], [place] [address] ?[phone] ?[email or web address]  ? ? ? ? ?Please follow up with your Gastroenterologist about your abdominal concerns: ? ?[facility] ?[place], [place] ?[place]: [phone]  Fax: [phone] ? ?

## 2021-05-29 ENCOUNTER — Other Ambulatory Visit: Payer: Self-pay

## 2021-05-29 ENCOUNTER — Encounter: Payer: Self-pay | Admitting: Internal Medicine

## 2021-05-29 ENCOUNTER — Ambulatory Visit (INDEPENDENT_AMBULATORY_CARE_PROVIDER_SITE_OTHER): Payer: BC Managed Care – PPO | Admitting: Internal Medicine

## 2021-05-29 DIAGNOSIS — Z8619 Personal history of other infectious and parasitic diseases: Secondary | ICD-10-CM | POA: Insufficient documentation

## 2021-05-29 NOTE — Progress Notes (Signed)
[facility] for [facility]      Reason for Consult:  History of typhoid fever  Referring Physician: Rosine Abe, FNP    Patient ID: Eric Mcbride, male    DOB: 02/24/1990, 31 y.o.   MRN: [id number]  HPI:   He is here at the request of his PCP due to his history of having typhoid fever.  He is originally from [place] and immigrated here 2 years ago and has remained here since that time.  His main complaint is his headache.  He endorses a headache that has been severe and debilitating for about 2 years.  It is associated with nausea.  He endorses weight loss - he weighed 79 kg 2 years ago and now down to 56 kg.  He eats poorly, has early satiety and nausea with food.  He has no fever but feels 'hot'.  He has no chills.  He reports bone pain.  HIV and hepatitis C negative.      Past Medical History:  Diagnosis Date   Acid reflux disease    Constipation    Dyspepsia    H. pylori infection     Prior to Admission medications   Medication Sig Start Date End Date Taking? Authorizing Provider  acetaminophen (TYLENOL) 500 MG tablet Take 1 tablet (500 mg total) by mouth every 6 (six) hours as needed. 05/22/21   Bo Merino I, NP  Caraway Oil-Levomenthol (FDGARD) 25-20.75 MG CAPS Take one by mouth 30-60 minutes before meals  Lot # [id number] exp 11/2022 Patient not taking: Reported on 05/22/2021 04/26/21   Armbruster, Carlota Raspberry, MD  cyclobenzaprine (FLEXERIL) 10 MG tablet Take 1 tablet (10 mg total) by mouth 3 (three) times daily as needed for muscle spasms. 05/22/21   Passmore, Jake Church I, NP  fluticasone-salmeterol (ADVAIR DISKUS) 250-50 MCG/ACT AEPB Inhale 1 puff into the lungs in the morning and at bedtime. 04/26/21 07/25/21  Armbruster, Carlota Raspberry, MD  linaclotide (LINZESS) 145 MCG CAPS capsule Take 1 capsule (145 mcg total) by mouth daily before breakfast. Medication Samples have been provided to the patient.  Drug name:Linzess       Strength: 145     Qty: 12 LOT:  [id number] Exp.Date: 06/2021  Dosing instructions: take on tablet by mouth daily  The patient has been instructed regarding the correct time, dose, and frequency of taking this medication, including desired effects and most common side effects.   Barb Merino 11:43 AM 04/26/2021 04/26/21   Armbruster, Carlota Raspberry, MD  Magnesium 100 MG CAPS Take by mouth. Patient not taking: Reported on 05/22/2021    [provider]  montelukast (SINGULAIR) 10 MG tablet Take 10 mg by mouth daily. 01/20/21   [provider]  pantoprazole (PROTONIX) 40 MG tablet Take 1 tablet (40 mg total) by mouth 2 (two) times daily. 04/26/21   Armbruster, Carlota Raspberry, MD  polyethylene glycol powder (GLYCOLAX/MIRALAX) 17 GM/SCOOP powder Take 17 gm by mouth two times a day 04/26/21   Armbruster, Carlota Raspberry, MD    No Known Allergies  Social History   Tobacco Use   Smoking status: Never   Smokeless tobacco: Never  Vaping Use   Vaping Use: Never used  Substance Use Topics   Alcohol use: Not Currently   Drug use: Not Currently    Family History  Family history unknown: Yes    Review of Systems  Constitutional: positive for malaise, anorexia, and weight loss or negative for fevers, chills, and sweats Respiratory: negative for cough  Gastrointestinal: positive for nausea, vomiting, and change in bowel habits, negative for diarrhea Integument/breast: negative for rash Hematologic/lymphatic: negative for lymphadenopathy Musculoskeletal: negative for myalgias and arthralgias Neurological: positive for headaches, dizziness, coordination problems, and weakness, negative for vertigo All other systems reviewed and are negative    Constitutional: in no apparent distress There were no vitals filed for this visit. EYES: anicteric ENMT: no thrush Respiratory: normal respiratory effort GI: soft Musculoskeletal: no edema Skin: no rash  Labs: Lab Results  Component Value Date   WBC 6.0 01/04/2021   HGB 16.3 01/04/2021    HCT 46.6 01/04/2021   MCV 79 01/04/2021   PLT 241 01/04/2021    Lab Results  Component Value Date   CREATININE 0.89 01/04/2021   BUN 21 (H) 01/04/2021   NA 141 01/04/2021   K 4.2 01/04/2021   CL 100 01/04/2021   CO2 24 01/04/2021    Lab Results  Component Value Date   ALT 17 01/04/2021   AST 24 01/04/2021   ALKPHOS 56 01/04/2021   BILITOT 1.1 01/04/2021     Assessment: history of typhoid - I discussed with him the natural history of typhoid and he is past the 3 week window, no concerns for typhoid fever. He was mainly worried about his headache.  Migraine is possible though seems most c/w a tension headache.  Does not wake him up at night and occurs during the day, not just in the am.  No concerning signs.  I will defer to his PCP for management and if referral to neurology indicated.  Extensive discussion of his symptoms with the interpretor done.    Plan: 1)  no work up indicated for his history of typhoid fever.   I have personally spent 60 minutes involved in face-to-face and non-face-to-face activities for this patient on the day of the visit. Professional time spent includes the following activities: Preparing to see the patient (review of tests), Obtaining and/or reviewing separately obtained history (admission/discharge record), Performing a medically appropriate examination and/or evaluation , Ordering medications/tests/procedures, referring and communicating with other health care professionals, Documenting clinical information in the EMR, Independently interpreting results (not separately reported), Communicating results to the patient/family/caregiver, Counseling and educating the patient/family/caregiver and Care coordination (not separately reported).

## 2021-06-10 ENCOUNTER — Ambulatory Visit: Payer: BC Managed Care – PPO | Admitting: Nurse Practitioner

## 2021-06-18 ENCOUNTER — Encounter: Payer: Self-pay | Admitting: Gastroenterology

## 2021-06-18 ENCOUNTER — Ambulatory Visit (INDEPENDENT_AMBULATORY_CARE_PROVIDER_SITE_OTHER): Payer: BC Managed Care – PPO | Admitting: Gastroenterology

## 2021-06-18 VITALS — BP 100/76 | HR 78 | Ht 66.0 in | Wt 123.2 lb

## 2021-06-18 DIAGNOSIS — R630 Anorexia: Secondary | ICD-10-CM

## 2021-06-18 DIAGNOSIS — R110 Nausea: Secondary | ICD-10-CM

## 2021-06-18 DIAGNOSIS — R1013 Epigastric pain: Secondary | ICD-10-CM

## 2021-06-18 DIAGNOSIS — K5900 Constipation, unspecified: Secondary | ICD-10-CM | POA: Diagnosis not present

## 2021-06-18 DIAGNOSIS — F411 Generalized anxiety disorder: Secondary | ICD-10-CM

## 2021-06-18 MED ORDER — ONDANSETRON HCL 4 MG PO TABS: 4.0000 mg | ORAL_TABLET | Freq: Four times a day (QID) | ORAL | 1 refills | Status: DC | PRN

## 2021-06-18 MED ORDER — MIRTAZAPINE 7.5 MG PO TABS: ORAL_TABLET | ORAL | 1 refills | Status: DC

## 2021-06-18 NOTE — Patient Instructions (Addendum)
We have sent the following medications to your pharmacy for you to pick up at your convenience: Zofran (Ondansetron) , Remeron ( Mirtazapine)   Start Miralax- dissolve 1 capful or (1 packet daily ) in at 8 ouces of water daily.    Start Remeron 7.'5mg'$  ( 1 tablet) at bedtime for 2 weeks, then increase to '15mg'$  (2 tablets) at bedtime.   You have been scheduled for a gastric emptying scan at [facility] on 06/27/21 at 7:30am. Please arrive at least 30  minutes prior to your appointment for registration. Please make certain not to have anything to eat or drink after midnight the night before your test. Hold all stomach medications (ex: Zofran, phenergan, Reglan) 48 hours prior to your test. If you need to reschedule your appointment, please contact radiology scheduling at [phone]. _____________________________________________________________________ A gastric-emptying study measures how long it takes for food to move through your stomach. There are several ways to measure stomach emptying. In the most common test, you eat food that contains a small amount of radioactive material. A scanner that detects the movement of the radioactive material is placed over your abdomen to monitor the rate at which food leaves your stomach. This test normally takes about 4 hours to complete.  ______________________________________________________________________ ??? ????? ??????? ?? ????? ???? ????? ?? ???? ???? ?? ????? ??? ?????: ?????? (????????????) ? ??????? (??????????)  Miralax ??? ??? - ??? ??? ?? 8 ???? ???? ?? 1 ?? ?? (??? ??? 1 ?????) ?? ???.  Remeron 7.'5mg'$  (1 ??????) ? ??? ?? ??? ?? ? 2 ????? ????? ??? ???? ??? ? ??? ?? ??? ?? '15mg'$  (2 ??????) ?? ??? ???.  ???? ? 06/27/21 ? ???? ?? 7:30 ??? ?? ????? ???? ????????? ?? ? ???? ???? ???? ???? ????? ???? ???. ??????? ???? ?? ?? ??? 30 ????? ???? ? ????????? ????? ????? ? ???? ??? ???? ????. ??????? ???? ??? ?????? ??? ?? ????? ? ??????? ???? ? ???? ???  ?????? ? ????? ?? ???? ????? ??? ?? ?? ???. ? ???? ??? ???? (? ???? ?? ????: ??????? ????????? ??????) ?? ??????? ??? 48 ????? ???? ?????. ?? ???? ????? ??? ?? ??? ??? ??? ????? ???? ??????? ???? ? ????????? ???? ??? ??? ?? [phone] ????? ?????. _____________________________________________________________________ ? ???? ? ???? ???? ?????? ?? ?????? ??? ?? ????? ????? ? ???? ?? ???? ???? ??? ????? ??? ????. ? ???? ? ???? ???? ?????? ???? ????? ???? ???? ???? ???. ?? ???? ??? ??????? ??? ???? ??? ????? ???? ?? ?? ????? ????? ????? ???? ???. ?? ????? ?? ? ????? ????? ????? ???? ??? ??? ????? ?? ??? ?? ?????? ??? ???? ??? ?????? ????? ?? ????? ????? ???? ??????. ?? ??????? ?????? ? ??????? ????? ?????? 4 ????? ??? ????.

## 2021-06-18 NOTE — Progress Notes (Signed)
HPI :  31 year old [place] male, non-English speaking, with a history of H. Pylori infection, here for a follow-up visit for dyspepsia, constipation, bloating.   Translator accompanies the patient today to provide history.   Recall he has undergone an evaluation with a EGD in November, he was negative for H. pylori, had a small hiatal hernia.  Tested negative for celiac disease.  Has been on some PPI over time, he has transition from omeprazole to pantoprazole, currently on Protonix 40 mg once daily. He previously complained of weight loss and abdominal pain and had a CT scan abdomen pelvis done in January which was normal without any concerning pathology.  He has had labs done without any concerning abnormalities to date.   At the last visit I had recommended he increase his Protonix to twice daily dosing to see if that would provide any additional benefit.  I also recommended some FD guard to use for dyspepsia, and to use some MiraLAX for constipation and bloating.  He states he did not tolerate the Protonix at twice daily dosing, this causes him to feel "dizzy" and he did not like the way he felt on higher dosing.  He reduce back to once daily dosing and tolerates it much better.  That being said he continues to have a lot of symptoms that bother him.  He feels full easily after he eats.  He has discomfort in his upper abdomen after he eats something.  He frequently feels nauseated but does not vomit.  He has a poor appetite in general and does not eat regularly.  He works the night shift at his job from 5 PM to early morning, goes to bed around 5 AM. He sounds rather frustrated with persistent symptoms at this point time.  He states the FD guard did not do anything.  He did try MiraLAX and states his bowels are moving much better.  Constipation not as bothersome to him and he is doing better in this regard.  He has been using MiraLAX or an herbal laxative over-the-counter which has worked for  him as well.  He has a variety of other elements for which she is seeing other providers.  He has ongoing headaches which continue to bother him, sometimes after he eats.  He is not taking anything for that.  We discussed seeing his primary care for that issue.  He also has some anxiety which she endorses, not taking anything for that.  He moved here from [place] 2 years ago, has been in [place] for about a year now.      EGD 11/14/2020: Esophagogastric landmarks identified. - 2 cm hiatal hernia. - Normal esophagus otherwise - A single gastric polyp. Resected and retrieved. - Normal stomach otherwise - biopsies taken to rule out H pylori - Normal duodenal bulb and second portion of the duodenum. Biopsied Path Report: 1. Surgical [P], duodenal FRAGMENTS NORMAL DUODENAL MUCOSA. THERE ARE NO DIAGNOSTIC FEATURES OF CELIAC DISEASE. 2. Surgical [P], gastric FRAGMENTS OF GASTRIC MUCOSA WITH MILD NONSPECIFIC INFLAMMATION MINIMAL VASCULAR ECTASIA CONSISTENT WITH REACTIVE GASTROPATHY. H. PYLORI AND INTESTINAL METAPLASIA ARE NOT IDENTIFIED. 3. Surgical [P], gastric polyp BENIGN HYPERPLASTIC GASTRIC POLYP. NEGATIVE FOR DYSPLASIA.   CT abdomen / pelvis 02/02/21: IMPRESSION: 1. No acute abdominopelvic findings on this study degraded by motion. 2. Moderate volume of formed stool throughout the colon suggestive of constipation.   Past Medical History:  Diagnosis Date   Acid reflux disease    Constipation    Dyspepsia  H. pylori infection      Past Surgical History:  Procedure Laterality Date   NO PAST SURGERIES     Family History  Family history unknown: Yes   Social History   Tobacco Use   Smoking status: Never   Smokeless tobacco: Never  Vaping Use   Vaping Use: Never used  Substance Use Topics   Alcohol use: Not Currently   Drug use: Not Currently   Current Outpatient Medications  Medication Sig Dispense Refill   fluticasone-salmeterol (ADVAIR DISKUS)  250-50 MCG/ACT AEPB Inhale 1 puff into the lungs in the morning and at bedtime. 1 each 0   montelukast (SINGULAIR) 10 MG tablet Take 10 mg by mouth daily.     pantoprazole (PROTONIX) 40 MG tablet Take 40 mg by mouth daily.     No current facility-administered medications for this visit.   No Known Allergies   Review of Systems: All systems reviewed and negative except where noted in HPI.   Lab Results  Component Value Date   WBC 6.0 01/04/2021   HGB 16.3 01/04/2021   HCT 46.6 01/04/2021   MCV 79 01/04/2021   PLT 241 01/04/2021    Lab Results  Component Value Date   CREATININE 0.89 01/04/2021   BUN 21 (H) 01/04/2021   NA 141 01/04/2021   K 4.2 01/04/2021   CL 100 01/04/2021   CO2 24 01/04/2021    Lab Results  Component Value Date   ALT 17 01/04/2021   AST 24 01/04/2021   ALKPHOS 56 01/04/2021   BILITOT 1.1 01/04/2021     Physical Exam: BP 100/76   Pulse 78   Ht '5\' 6"'$  (1.676 m)   Wt 123 lb 4 oz (55.9 kg)   BMI 19.89 kg/m  Constitutional: Pleasant,well-developed, male in no acute distress. Neurological: Alert and oriented to person place and time. Psychiatric: Normal mood and affect. Behavior is normal.   ASSESSMENT AND PLAN: 31 year old male here for reassessment of the following:  Dyspepsia Loss of appetite Nausea Constipation Anxiety  He had an extensive evaluation at this point, I do not see anything concerning on the work-up so far.  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  Gastroparesis may also be possible although he has no risk factors for this.  I tried to explain this to him today through translator.  Has neck step some going to recommend a gastric emptying study to assess for gastroparesis.  In the interim, he does not tolerate higher dosing of PPI so we will keep him at 40 mg daily to control his reflux.  We discussed other options.  FD guard did not help him.  He does  have some nausea so we will provide some Zofran for him to use as needed for that.  Ultimately I think treating his anxiety and something for dyspepsia would be in his best interest, I was thinking of either Remeron or buspirone.  Given his loss of appetite I think Remeron may be a better choice.  We will start 7.5 mg prior to sleeping for 2 weeks, then increase to 15 mg prior to sleep thereafter.  Hopefully adding Remeron Zofran will increase his appetite a bit.  His constipation is much better controlled using a bowel regimen, he will continue that for now.  I think he should otherwise follow-up with his primary care for further management of anxiety and headaches.   Plan: - schedule  gastric emptying study - continue protonix '40mg'$  once daily - start Zofran '4mg'$  ODT every 6 hours PRN - start Remeron 7.'5mg'$  prior to sleep for 2 weeks and then increase to '15mg'$  prior to sleep - continue bowel regimen - follow up with PCP for headaches, anxiety  I spent 40 minutes of time, including in depth chart review, face-to-face time with the patient, coordinating care, and documentation.   Jolly Mango, MD [facility]

## 2021-06-27 ENCOUNTER — Encounter (HOSPITAL_COMMUNITY)
Admission: RE | Admit: 2021-06-27 | Discharge: 2021-06-27 | Disposition: A | Discharge status: Home / Self Care | Payer: BC Managed Care – PPO | Source: Ambulatory Visit | Attending: Gastroenterology | Admitting: Gastroenterology

## 2021-06-27 DIAGNOSIS — R110 Nausea: Secondary | ICD-10-CM | POA: Diagnosis present

## 2021-06-27 DIAGNOSIS — R1013 Epigastric pain: Secondary | ICD-10-CM | POA: Insufficient documentation

## 2021-06-27 DIAGNOSIS — F411 Generalized anxiety disorder: Secondary | ICD-10-CM | POA: Insufficient documentation

## 2021-06-27 DIAGNOSIS — K5900 Constipation, unspecified: Secondary | ICD-10-CM | POA: Diagnosis present

## 2021-06-27 DIAGNOSIS — R630 Anorexia: Secondary | ICD-10-CM | POA: Insufficient documentation

## 2021-06-27 MED ORDER — TECHNETIUM TC 99M SULFUR COLLOID
2.2000 | Freq: Once | INTRAVENOUS | Status: AC
  Administered 2021-06-27: 2.2 via INTRAVENOUS

## 2021-06-30 ENCOUNTER — Encounter (HOSPITAL_COMMUNITY): Payer: Self-pay | Admitting: Emergency Medicine

## 2021-06-30 ENCOUNTER — Ambulatory Visit (HOSPITAL_COMMUNITY)
Admission: EM | Admit: 2021-06-30 | Discharge: 2021-06-30 | Disposition: A | Discharge status: Home / Self Care | Payer: BC Managed Care – PPO | Attending: Physician Assistant | Admitting: Physician Assistant

## 2021-06-30 DIAGNOSIS — R0602 Shortness of breath: Secondary | ICD-10-CM

## 2021-06-30 DIAGNOSIS — J4541 Moderate persistent asthma with (acute) exacerbation: Secondary | ICD-10-CM | POA: Diagnosis not present

## 2021-06-30 DIAGNOSIS — R051 Acute cough: Secondary | ICD-10-CM

## 2021-06-30 MED ORDER — ALBUTEROL SULFATE (2.5 MG/3ML) 0.083% IN NEBU
2.5000 mg | INHALATION_SOLUTION | RESPIRATORY_TRACT | Status: DC
  Administered 2021-06-30: 2.5 mg via RESPIRATORY_TRACT

## 2021-06-30 MED ORDER — FLUTICASONE-SALMETEROL 500-50 MCG/ACT IN AEPB: 1.0000 | INHALATION_SPRAY | Freq: Two times a day (BID) | RESPIRATORY_TRACT | 1 refills | Status: DC

## 2021-06-30 MED ORDER — PREDNISONE 20 MG PO TABS: 20.0000 mg | ORAL_TABLET | Freq: Once | ORAL | Status: DC

## 2021-06-30 MED ORDER — PREDNISONE 20 MG PO TABS
ORAL_TABLET | ORAL | Status: AC
  Filled 2021-06-30: qty 1

## 2021-06-30 MED ORDER — ALBUTEROL SULFATE HFA 108 (90 BASE) MCG/ACT IN AERS: 1.0000 | INHALATION_SPRAY | Freq: Four times a day (QID) | RESPIRATORY_TRACT | 2 refills | Status: DC | PRN

## 2021-06-30 MED ORDER — PREDNISONE 10 MG (21) PO TBPK
ORAL_TABLET | Freq: Every day | ORAL | 1 refills | Status: DC
Start: 2021-06-30 — End: 2021-10-16

## 2021-06-30 MED ORDER — ALBUTEROL SULFATE (2.5 MG/3ML) 0.083% IN NEBU
INHALATION_SOLUTION | RESPIRATORY_TRACT | Status: AC
  Filled 2021-06-30: qty 3

## 2021-08-22 ENCOUNTER — Ambulatory Visit (HOSPITAL_COMMUNITY): Payer: Self-pay | Admitting: Psychiatry

## 2021-10-04 ENCOUNTER — Encounter: Payer: Self-pay | Admitting: *Deleted

## 2021-10-04 NOTE — Congregational Nurse Program (Signed)
  Dept: [phone]   Congregational Nurse Program Note  Date of Encounter: 10/04/2021  Past Medical History: Past Medical History:  Diagnosis Date   Acid reflux disease    Constipation    Dyspepsia    H. pylori infection     Encounter Details:  CNP Questionnaire - 10/04/21 0856       Questionnaire   Do you give verbal consent to treat you today? Yes    Location Patient [place]    Visit Setting Phone/Text/Email    Patient Status Refugee    Sport and exercise psychologist or [place] Referral N/A    Medication Have Medication Insecurities    Medical Provider Yes    Screening Referrals N/A    Medical Referral [place] N/A    Transportation Need transportation assistance    Housing/Utilities N/A    Interpersonal Safety N/A    Intervention [place];Advocate;Educate;Support;Counsel    ED Visit Averted Yes    Life-Saving Intervention Made N/A            Client contacted this CN stating that he wanted to reschedule his behavioral health appointment.  He did cancel his appointment in August stating he became confused.  Appointment made with outpatient behavioral health for October 23 at 3:00 pm with arrival time at 2:45 pm.  This CN will assure that transportation is provided and that client gets to his appointment.  I have texted client with appointment date and time.  Karene Fry, RN, MSN, [place] Office [phone] Cell

## 2021-10-16 ENCOUNTER — Encounter (HOSPITAL_COMMUNITY): Payer: Self-pay

## 2021-10-16 ENCOUNTER — Ambulatory Visit (HOSPITAL_COMMUNITY)
Admission: EM | Admit: 2021-10-16 | Discharge: 2021-10-16 | Disposition: A | Discharge status: Home / Self Care | Payer: BC Managed Care – PPO | Attending: Family Medicine | Admitting: Family Medicine

## 2021-10-16 DIAGNOSIS — K0889 Other specified disorders of teeth and supporting structures: Secondary | ICD-10-CM | POA: Diagnosis not present

## 2021-10-16 MED ORDER — AMOXICILLIN 875 MG PO TABS: 875.0000 mg | ORAL_TABLET | Freq: Two times a day (BID) | ORAL | 0 refills | Status: AC

## 2021-10-16 MED ORDER — IBUPROFEN 800 MG PO TABS: 800.0000 mg | ORAL_TABLET | Freq: Three times a day (TID) | ORAL | 0 refills | Status: DC | PRN

## 2021-10-16 MED ORDER — KETOROLAC TROMETHAMINE 30 MG/ML IJ SOLN
30.0000 mg | Freq: Once | INTRAMUSCULAR | Status: AC
  Administered 2021-10-16: 30 mg via INTRAMUSCULAR

## 2021-10-16 MED ORDER — KETOROLAC TROMETHAMINE 30 MG/ML IJ SOLN
INTRAMUSCULAR | Status: AC
  Filled 2021-10-16: qty 1

## 2021-10-16 NOTE — ED Provider Notes (Signed)
[facility]    CSN: [id number] Arrival date & time: 10/16/21  1538      History   Chief Complaint Chief Complaint  Patient presents with   Dental Pain    HPI Eric Mcbride is a 31 y.o. male.    Dental Pain  Here for pain in his teeth for 2 to 3 days.  He has right lower teeth around the canine and his left posterior lower teeth have been bothering him.  No fever or chills.  He has allergy medicine last creatinine was normal.  Past Medical History:  Diagnosis Date   Acid reflux disease    Constipation    Dyspepsia    H. 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 Authorizing Provider  amoxicillin (AMOXIL) 875 MG tablet Take 1 tablet (875 mg total) by mouth 2 (two) times daily for 7 days. 10/16/21 10/23/21 Yes Barrett Henle, MD  ibuprofen (ADVIL) 800 MG tablet Take 1 tablet (800 mg total) by mouth every 8 (eight) hours as needed (pain). 10/16/21  Yes Barrett Henle, MD  albuterol (VENTOLIN HFA) 108 (90 Base) MCG/ACT inhaler Inhale 1-2 puffs into the lungs every 6 (six) hours as needed for wheezing or shortness of breath. 06/30/21   Nyoka Lint, PA-C  fluticasone-salmeterol (ADVAIR) 500-50 MCG/ACT AEPB Inhale 1 puff into the lungs in the morning and at bedtime. 06/30/21   Nyoka Lint, PA-C  mirtazapine (REMERON) 7.5 MG tablet Take 1 tablet by mouth at bedtime for 2 weeks, then increase to 2 tablet by mouth at bedtime thereafter. 06/18/21   Armbruster, Carlota Raspberry, MD  montelukast (SINGULAIR) 10 MG  tablet Take 10 mg by mouth daily. 01/20/21   [provider]  pantoprazole (PROTONIX) 40 MG tablet Take 40 mg by mouth daily.    [provider]    Family History Family History  Family history unknown: Yes    Social History Social History   Tobacco Use   Smoking status: Never   Smokeless tobacco: Never  Vaping Use   Vaping Use: Never used  Substance Use Topics   Alcohol use: Not Currently   Drug use: Not Currently     Allergies   Patient has no known allergies.   Review of Systems Review of Systems   Physical Exam Triage Vital Signs ED Triage Vitals [10/16/21 1703]  Enc Vitals Group     BP 121/64     Pulse Rate 62     Resp 15     Temp 98.7 F (37.1 C)     Temp Source Oral     SpO2 96 %     Weight      Height      Head Circumference      Peak Flow      Pain Score      Pain Loc      Pain Edu?      Excl. in [place]?    No data found.  Updated Vital  Signs BP 121/64 (BP Location: Left Arm)   Pulse 62   Temp 98.7 F (37.1 C) (Oral)   Resp 15   SpO2 96%   Visual Acuity Right Eye Distance:   Left Eye Distance:   Bilateral Distance:    Right Eye Near:   Left Eye Near:    Bilateral Near:     Physical Exam Vitals reviewed.  Constitutional:      General: He is not in acute distress.    Appearance: He is not ill-appearing, toxic-appearing or diaphoretic.  HENT:     Nose: Nose normal.     Mouth/Throat:     Mouth: Mucous membranes are moist.     Pharynx: No oropharyngeal exudate or posterior oropharyngeal erythema.     Comments: No swelling or drainage in the oral cavity Eyes:     Extraocular Movements: Extraocular movements intact.     Conjunctiva/sclera: Conjunctivae normal.     Pupils: Pupils are equal, round, and reactive to light.  Cardiovascular:     Rate and Rhythm: Normal rate and regular rhythm.     Heart sounds: No murmur heard. Skin:    Coloration: Skin is not jaundiced or pale.  Neurological:     Mental Status: He is  alert and oriented to person, place, and time.  Psychiatric:        Behavior: Behavior normal.      UC Treatments / Results  Labs (all labs ordered are listed, but only abnormal results are displayed) Labs Reviewed - No data to display  EKG   Radiology No results found.  Procedures Procedures (including critical care time)  Medications Ordered in UC Medications  ketorolac (TORADOL) 30 MG/ML injection 30 mg (has no administration in time range)    Initial Impression / Assessment and Plan / UC Course  I have reviewed the triage vital signs and the nursing notes.  Pertinent labs & imaging results that were available during my care of the patient were reviewed by me and considered in my medical decision making (see chart for details).        I will provide antibiotics and pain relief.  We discussed his calling the number on the back of his insurance card to find a dentist locally Final Clinical Impressions(s) / UC Diagnoses   Final diagnoses:  Pain, dental     Discharge Instructions      You have been given a shot of Toradol 30 mg today.  Take amoxicillin 875 mg--1 tab twice daily for 7 days  Take ibuprofen 800 mg--1 tab every 8 hours as needed for pain.  Call the number on the back of your insurance card to find a dentist locally.     ED Prescriptions     Medication Sig Dispense Auth. Provider   amoxicillin (AMOXIL) 875 MG tablet Take 1 tablet (875 mg total) by mouth 2 (two) times daily for 7 days. 14 tablet Sheketa Ende, Gwenlyn Perking, MD   ibuprofen (ADVIL) 800 MG tablet Take 1 tablet (800 mg total) by mouth every 8 (eight) hours as needed (pain). 21 tablet Krystyl Cannell, Gwenlyn Perking, MD      PDMP not reviewed this encounter.   Barrett Henle, MD 10/16/21 [phone]

## 2021-10-16 NOTE — ED Notes (Signed)
Pt called from waiting area without response. 

## 2021-10-16 NOTE — ED Triage Notes (Signed)
Pt reports dental pain for several weeks.  

## 2021-10-16 NOTE — Discharge Instructions (Addendum)
You have been given a shot of Toradol 30 mg today.  Take amoxicillin 875 mg--1 tab twice daily for 7 days  Take ibuprofen 800 mg--1 tab every 8 hours as needed for pain.  Call the number on the back of your insurance card to find a dentist locally.

## 2021-10-28 ENCOUNTER — Ambulatory Visit (HOSPITAL_BASED_OUTPATIENT_CLINIC_OR_DEPARTMENT_OTHER): Payer: BC Managed Care – PPO | Admitting: Psychiatry

## 2021-10-28 ENCOUNTER — Encounter: Payer: Self-pay | Admitting: *Deleted

## 2021-10-28 ENCOUNTER — Encounter (HOSPITAL_COMMUNITY): Payer: Self-pay | Admitting: Psychiatry

## 2021-10-28 DIAGNOSIS — F330 Major depressive disorder, recurrent, mild: Secondary | ICD-10-CM

## 2021-10-28 DIAGNOSIS — F411 Generalized anxiety disorder: Secondary | ICD-10-CM

## 2021-10-28 DIAGNOSIS — F329 Major depressive disorder, single episode, unspecified: Secondary | ICD-10-CM | POA: Insufficient documentation

## 2021-10-28 MED ORDER — FLUOXETINE HCL 10 MG PO CAPS: 10.0000 mg | ORAL_CAPSULE | Freq: Every day | ORAL | 2 refills | Status: DC

## 2021-10-28 MED ORDER — PROPRANOLOL HCL 10 MG PO TABS: 10.0000 mg | ORAL_TABLET | Freq: Two times a day (BID) | ORAL | 0 refills | Status: DC | PRN

## 2021-10-28 NOTE — Congregational Nurse Program (Signed)
  Dept: [phone]   Congregational Nurse Program Note  Date of Encounter: 10/28/2021  Past Medical History: Past Medical History:  Diagnosis Date   Acid reflux disease    Constipation    Dyspepsia    H. pylori infection     Encounter Details:  CNP Questionnaire - 10/28/21 1500       Questionnaire   Ask client: Do you give verbal consent for me to treat you today? Yes    Student Assistance N/A    Location Patient Served  N/A    Visit Setting with Client MD Office    Patient Status Refugee    Insurance Private or [place]    Insurance/Financial Assistance Referral N/A    Medication Have Medication Insecurities    Medical Provider Yes    Screening Referrals Made N/A    Medical Referrals Made N/A    Medical Appointment [place]    Recently w/o PCP, now 1st time PCP visit completed due to CNs referral or appointment made N/A    Food N/A    Transportation Need transportation assistance;Provided transportation assistance    Housing/Utilities N/A    Interpersonal Safety N/A    Interventions Advocate/Support;Navigate Healthcare System;Reviewed Medications    Abnormal to Normal Screening Since Last CN Visit N/A    Screenings CN Performed N/A    Sent Client to Lab for: N/A    Did client attend any of the following based off CNs referral or appointments made? N/A    ED Visit Averted Yes    Life-Saving Intervention Made N/A            Accompanied client to first [facility] appointment today.  Client has been depressed and says his mental health has not been good.  Follow up appointment made for 12/02/21.  Client to pick up prescription in an hour from [place] close to his home.  Client happy with results today.  Will follow up with client as requested.  Karene Fry, RN, MSN, [place] Office [phone] Cell

## 2021-10-28 NOTE — Progress Notes (Signed)
Psychiatric Initial Adult Assessment   Patient Identification: Eric Mcbride MRN:  [id number] Date of Evaluation:  10/28/2021 Referral Source: PCP Chief Complaint:   Chief Complaint  Patient presents with   Depression   Anxiety   Establish Care   Visit Diagnosis:    ICD-10-CM   1. GAD (generalized anxiety disorder)  F41.1 FLUoxetine (PROZAC) 10 MG capsule    propranolol (INDERAL) 10 MG tablet    2. Mild episode of recurrent major depressive disorder (HCC)  F33.0 FLUoxetine (PROZAC) 10 MG capsule       Assessment:  Eric Mcbride is a 31 y.o. y.o. male with a history of anxiety who presents in person to [address] [facility] for initial evaluation on 10/28/21.    Patient reports symptoms of depression and anxiety including low mood, anhedonia, amotivation, decreased energy, poor concentration, constant worry, social anxiety, and physical symptoms with anxiety including palpitations, shortness of breath, diaphoresis, and nausea.  He denies any suicidal ideation or thoughts of self-harm along with any history of mania, psychosis, paranoia, or delusions.  Psychosocially patient is an immigrant from [place] and moved here with his brother 2 years ago.  His wife and son are still in [place] and have been unable to come here despite patient wanting them to.  He has a partial understanding of English which has led to him being more isolated.  At this time patient meets criteria for MDD and generalized anxiety disorder he also has a number of traits consistent with social anxiety disorder.  A number of assessments were performed during the evaluation today including nutritional assessment which was 0, pain assessment which showed no pain, PHQ-9 which they scored a 16 on, GAD-7 which they scored a 7 on, and [place] suicide severity screening which showed no risk.  Based on these assessments patient would benefit from medication adjustment to better  target their symptoms.   Plan: - Start Prozac 10 mg QD - Start propranolol 10 mg BID prn for anxiety - CMP, CBC, and glucose reviewed - Crisis resources discussed - Follow up in a month  History of Present Illness: Patient presented today alongside an interpreter who assisted in translation.  He reports that he has been suffering from a lot of anxiety, depression, feelings of worry, and fear.  He notes that he has had these symptoms for 10 to 15 years now and they started back when he was in [place].  At that time he had seen a prescriber who has started him on a medication that seemed to help however the symptoms returned after he stopped the medication.  Patient is unsure what that medication was.  When the symptoms first started he reports that he was a student around seventh grade and he had been doing well in school.  After the symptoms onset he started to do worse in school and have anxiety with talking in front of others.  Patient notes that he has constant worry in addition to increased anxiety in social situations.  He finds meeting new people and large groups difficult.  Patient describes experiencing physical symptoms of shortness of breath, palpitations, lightheadedness, stomach pain, and sweatiness.  He is also worried that there is something wrong with him because he is experiencing these symptoms.  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  He has been working at a [employer] and works around [employer]  hours 5 days a week.  Patient reports that he does not enjoy the work but he feels better when he is working.  When he is home he is bored and the symptoms tend to get worse.  Patient denies doing much at home other than sleep.  He denies that ever reaching the point of suicidality or thoughts of self-harm.  On exploration patient reports that he does not feel lonely here and while he does worry about his wife  and son back in [place] he feels they are safe.  Patient notes is able to talk to his wife and son every day if needed and of nothing is happening then we will talk every few days.  We discussed treatment options including medications and behavioral activation techniques.  Patient notes that he has tried mirtazapine from his PCP with little benefit.  He also tried a medicine [place] but is unsure what it was.  We discussed Prozac and propranolol and went over the risk and benefits.  Patient was interested in both of these though had some concern of sexual side effects.  He was open to giving Prozac to try and discussing it again further if side effects were to develop.  In regards to behavioral activation patient was encouraged to engage in areas of interest while he is not at work.  Associated Signs/Symptoms: Depression Symptoms:  depressed mood, anhedonia, fatigue, difficulty concentrating, anxiety, loss of energy/fatigue, disturbed sleep, (Hypo) Manic Symptoms:   Denies Anxiety Symptoms:  Excessive Worry, Social Anxiety, Psychotic Symptoms:   Denies PTSD Symptoms: Had a traumatic exposure:  Emotional  Past Psychiatric History: Patient had former psychiatric care in [place] for anxiety and depression though is unsure what it was.  He was trialed on Remeron by his primary care provider with little benefit.  Patient denies ever being involved in therapy, he devise any suicide attempts, and he denies any prior psychiatric hospitalizations.  Patient denies any substance use.  Previous Psychotropic Medications: Yes   Substance Abuse History in the last 12 months:  No.  Consequences of Substance Abuse: NA  Past Medical History:  Past Medical History:  Diagnosis Date   Acid reflux disease    Constipation    Dyspepsia    H. pylori infection     Past Surgical History:  Procedure Laterality Date   NO PAST SURGERIES      Family Psychiatric History: Unsure  Family  History:  Family History  Family history unknown: Yes    Social History:   Social History   Socioeconomic History   Marital status: Married    Spouse name: Not on file   Number of children: 1   Years of education: Not on file   Highest education level: Not on file  Occupational History   Occupation: [employer]  Tobacco Use   Smoking status: Never   Smokeless tobacco: Never  Vaping Use   Vaping Use: Never used  Substance and Sexual Activity   Alcohol use: Not Currently   Drug use: Not Currently   Sexual activity: Yes    Birth control/protection: None  Other Topics Concern   Not on file  Social History Narrative   Not on file   Social Determinants of Health   Financial Resource Strain: Not on file  Food Insecurity: Not on file  Transportation Needs: Not on file  Physical Activity: Not on file  Stress: Not on file  Social Connections: Not on file    Additional Social History: Patient currently lives  with his brother and the 2 of them working at [employer].  He works the overnight shift.  Patient has a wife and a son who are back in [place] who he hopes to bring here but has had difficulty doing so.  Prior to coming to the [place] 2 years ago he was soldier in [place].  Allergies:  No Known Allergies  Metabolic Disorder Labs: No results found for: "HGBA1C", "MPG" No results found for: "PROLACTIN" No results found for: "CHOL", "TRIG", "HDL", "CHOLHDL", "VLDL", "LDLCALC" Lab Results  Component Value Date   TSH 1.130 10/01/2020    Therapeutic Level Labs: No results found for: "LITHIUM" No results found for: "CBMZ" No results found for: "VALPROATE"  Current Medications: Current Outpatient Medications  Medication Sig Dispense Refill   FLUoxetine (PROZAC) 10 MG capsule Take 1 capsule (10 mg total) by mouth daily. 30 capsule 2   propranolol (INDERAL) 10 MG tablet Take 1 tablet (10 mg total) by mouth 2 (two) times daily as needed (for anxiety). 60  tablet 0   albuterol (VENTOLIN HFA) 108 (90 Base) MCG/ACT inhaler Inhale 1-2 puffs into the lungs every 6 (six) hours as needed for wheezing or shortness of breath. 18 g 2   fluticasone-salmeterol (ADVAIR) 500-50 MCG/ACT AEPB Inhale 1 puff into the lungs in the morning and at bedtime. 60 each 1   ibuprofen (ADVIL) 800 MG tablet Take 1 tablet (800 mg total) by mouth every 8 (eight) hours as needed (pain). 21 tablet 0   montelukast (SINGULAIR) 10 MG tablet Take 10 mg by mouth daily.     pantoprazole (PROTONIX) 40 MG tablet Take 40 mg by mouth daily.     No current facility-administered medications for this visit.    Musculoskeletal: Strength & Muscle Tone: within normal limits Gait & Station: normal Patient leans: N/A  Psychiatric Specialty Exam: Review of Systems  There were no vitals taken for this visit.There is no height or weight on file to calculate BMI.  General Appearance: Disheveled and Fairly Groomed  Eye Contact:  Poor  Speech:  Clear and Coherent  Volume:  Increased  Mood:  Anxious and Irritable  Affect:  Constricted  Thought Process:  Goal Directed  Orientation:  Full (Time, Place, and Person)  Thought Content:  NA  Suicidal Thoughts:  No  Homicidal Thoughts:  No  Memory:  NA  Judgement:  Fair  Insight:  Fair  Psychomotor Activity:  Normal  Concentration:  Concentration: Good  Recall:  [place] of Knowledge:Fair  Language: Fair  Akathisia:  No    AIMS (if indicated):  not done  Assets:  Desire for Improvement Financial Resources/Insurance Housing  ADL's:  Intact  Cognition: WNL  Sleep:  Good   Screenings: GAD-7    Flowsheet Row Office Visit from 10/28/2021 in [facility] Office Visit from 10/01/2020 in [place] [facility]  Total GAD-7 Score 7 0      PHQ2-9    [place] Office Visit from 10/28/2021 in [facility] Office Visit from 05/29/2021 in [facility] Office Visit from 01/04/2021 in [place] Office Visit from 12/10/2020 in [place] Office Visit from 10/19/2020 in [place]  PHQ-2 Total Score 3 2 0 0 0  PHQ-9 Total Score 16 -- 0 0 --      [place] Office Visit from 10/28/2021 in [facility]  ED from 10/16/2021 in [facility] ED from 06/30/2021 in [facility] [place] No Risk No Risk No Risk        Collaboration of Care: Medication Management AEB medication prescription and Primary Care Provider AEB chart review  Patient/Guardian was advised Release of Information must be obtained prior to any record release in order to collaborate their care with an outside provider. Patient/Guardian was advised if they have not already done so to contact the registration department to sign all necessary forms in order for [place] to release information regarding their care.   Consent: Patient/Guardian gives verbal consent for treatment and assignment of benefits for services provided during this visit. Patient/Guardian expressed understanding and agreed to proceed.   Vista Mink, MD 10/23/20234:45 PM

## 2021-11-23 ENCOUNTER — Other Ambulatory Visit (HOSPITAL_COMMUNITY): Payer: Self-pay | Admitting: Psychiatry

## 2021-11-23 DIAGNOSIS — F411 Generalized anxiety disorder: Secondary | ICD-10-CM

## 2021-11-30 ENCOUNTER — Encounter: Payer: Self-pay | Admitting: *Deleted

## 2021-11-30 NOTE — Congregational Nurse Program (Signed)
  Dept: [phone]   Congregational Nurse Program Note  Date of Encounter: 11/30/2021  Past Medical History: Past Medical History:  Diagnosis Date   Acid reflux disease    Constipation    Dyspepsia    H. pylori infection     Encounter Details:  CNP Questionnaire - 11/30/21 1957       Questionnaire   Ask client: Do you give verbal consent for me to treat you today? Yes    Student Assistance N/A    Location Patient Served  N/A    Visit Setting with Client Phone/Text/Email    Patient Status Refugee    Sport and exercise psychologist or [place]    Insurance/Financial Assistance Referral N/A    Medication Have Medication Insecurities    Medical Provider Yes    Screening Referrals Made N/A    Medical Referrals Made N/A    Medical Appointment [place]    Recently w/o PCP, now 1st time PCP visit completed due to CNs referral or appointment made N/A    Food N/A    Transportation Need transportation assistance    Housing/Utilities N/A    Interpersonal Safety N/A    Interventions Advocate/Support;Navigate Healthcare System;Counsel;Educate    Abnormal to Normal Screening Since Last CN Visit N/A    Screenings CN Performed N/A    Sent Client to Lab for: N/A    Did client attend any of the following based off CNs referral or appointments made? N/A    ED Visit Averted Yes    Life-Saving Intervention Made N/A            Received texts from client regarding appointment scheduled for Monday 11/27.  Client had received a text from MD office that confused client.  I reviewed the text, checked his appointment in Epic and then texted client.  Will assure that client arrives at appointment on Monday 11/27 prior to 0930 appointment time.  Client is aware of this information.  Karene Fry, RN, MSN, [place] Office [phone] Cell

## 2021-12-02 ENCOUNTER — Encounter (HOSPITAL_COMMUNITY): Payer: Self-pay | Admitting: Psychiatry

## 2021-12-02 ENCOUNTER — Encounter: Payer: Self-pay | Admitting: *Deleted

## 2021-12-02 ENCOUNTER — Ambulatory Visit (HOSPITAL_BASED_OUTPATIENT_CLINIC_OR_DEPARTMENT_OTHER): Payer: BC Managed Care – PPO | Admitting: Psychiatry

## 2021-12-02 ENCOUNTER — Telehealth (HOSPITAL_COMMUNITY): Payer: Self-pay | Admitting: *Deleted

## 2021-12-02 VITALS — BP 122/84 | HR 61 | Temp 97.3°F | Ht 66.0 in | Wt 123.8 lb

## 2021-12-02 DIAGNOSIS — F330 Major depressive disorder, recurrent, mild: Secondary | ICD-10-CM | POA: Diagnosis not present

## 2021-12-02 DIAGNOSIS — F411 Generalized anxiety disorder: Secondary | ICD-10-CM

## 2021-12-02 NOTE — Congregational Nurse Program (Signed)
  Dept: [phone]   Congregational Nurse Program Note  Date of Encounter: 12/02/2021  Past Medical History: Past Medical History:  Diagnosis Date   Acid reflux disease    Constipation    Dyspepsia    H. pylori infection     Encounter Details:  CNP Questionnaire - 12/02/21 0930       Questionnaire   Ask client: Do you give verbal consent for me to treat you today? Yes    Student Assistance N/A    Location Patient Served  N/A    Visit Setting with Client Home;MD Office    Patient Status Refugee    Insurance Private or [place]    Insurance/Financial Assistance Referral N/A    Medication Have Medication Insecurities    Medical Provider Yes    Screening Referrals Made N/A    Medical Referrals Made N/A    Medical Appointment [place]    Recently w/o PCP, now 1st time PCP visit completed due to CNs referral or appointment made N/A    Food N/A    Transportation Need transportation assistance;Provided transportation assistance    Housing/Utilities N/A    Interpersonal Safety N/A    Interventions Advocate/Support;Navigate Healthcare System;Counsel;Educate    Abnormal to Normal Screening Since Last CN Visit N/A    Screenings CN Performed N/A    Sent Client to Lab for: N/A    Did client attend any of the following based off CNs referral or appointments made? N/A    ED Visit Averted Yes    Life-Saving Intervention Made N/A            Accompanied client to psychiatric appointment today.  New appointment made for 02/24.  Interpreter was present.  Discussed client reservations about taking medications prescribed by psychiatrist.  Client did discuss medications with MD today.  Will follow up with client as desired.  Karene Fry, RN, MSN, [place] Office [phone] Cell

## 2021-12-02 NOTE — Progress Notes (Unsigned)
BH MD/PA/NP OP Progress Note  12/02/2021 12:48 PM Eric Mcbride  MRN:  [id number]  Visit Diagnosis:    ICD-10-CM   1. GAD (generalized anxiety disorder)  F41.1     2. Mild episode of recurrent major depressive disorder (HCC)  F33.0       Assessment: Eric Mcbride is a 31 y.o. y.o. male with a history of anxiety who presented to [place] [facility] for initial evaluation on 10/28/21.    During initial evaluation patient reported symptoms of depression and anxiety including low mood, anhedonia, amotivation, decreased energy, poor concentration, constant worry, social anxiety, and physical symptoms with anxiety including palpitations, shortness of breath, diaphoresis, and nausea.  He denied any suicidal ideation or thoughts of self-harm along with any history of mania, psychosis, paranoia, or delusions.  Psychosocially patient is an immigrant from [place] and moved here with his brother 2 years ago.  His wife and son are still in [place] and have been unable to come here despite patient wanting them to.  He has a partial understanding of English which has led to him being more isolated.  At this time patient meets criteria for MDD and generalized anxiety disorder he also has a number of traits consistent with social anxiety disorder.  Eric Mcbride presents for follow-up evaluation. Today, 12/02/21, patient reports no improvement in his anxiety/depression over the past month.  He reports significant side effects from the Prozac and propranolol including dizziness, nausea, and vomiting resulting in discontinuing the medication in 7 and 3 days respectively.  Patient reports having pre-existing nausea, vomiting, stomach pain, and decreased appetite for the past year for which he has had GI workup that came back negative.  He had been prescribed pantoprazole for symptoms however discontinued it several months ago.  Of note patient also endorses experiencing  headaches, nausea, vomiting, and dizziness from looking at screens which started around 3 months ago. While it is possible that patient's nausea, vomiting, and dizziness are secondary to the Prozac and propranolol it seems less likely due to the symptoms being present both before and after being on the medication.  We did discuss the possibility of trying an alternative medication however patient declined at this time due to his concerns that it would just make his symptoms worse.  We recommended reconsulting with his PCP and GI provider for other antiemetic/antacid medications options.  We also referred patient to a neurologist to explore the new onset of the symptoms when looking at screens.  Patient will follow up in 2 months and we will reexplore starting new medications for anxiety.  Plan: - Can consider starting Atarax 10 mg TID prn for anxiety, patient declined at this time - Discontinue Prozac due to adverse side effect - Discontinue propranolol due to adverse side effects - CMP, CBC, and glucose reviewed - Crisis resources discussed - Refer for a neurologist - Follow up in a month   Chief Complaint:  Chief Complaint  Patient presents with   Follow-up   Anxiety   HPI: Translator was present for the interview and assisted patient in communicating with patient's approval.  Patient presents today reporting continued difficulty with anxiety over the past month.  He took the Prozac for 1 week however had no improvement in his anxiety symptoms.  Instead he developed a number of side effects including dizziness, vomiting, and nausea.  Patient also took the propranolol for 3 days however experienced similar side effects of dizziness and nausea.  There was some improvement  in his GI symptoms after discontinuing the medications.  Of note patient reports that he has had difficulty eating for an extended period of time and experience significant nausea whenever he takes medication.  He notes that  because of this he has stopped taking medications including his Protonix and Singulair.  We explored the symptoms with the patient and he notes that his been over a year since the nausea first started.  Patient reports that he had met with a gastroenterologist who did an endoscopy which showed nothing of significance.  On chart review patient does have a diagnosis of H. pylori in the past and gastroenterologist complete workup with no identifiable cause for his nausea.  Patient also endorsed experiencing symptoms of headache, dizziness, nausea, and vomiting when he looks at TV/phone screens.  Patient reports that the symptoms been going on for around 3 months or so and seemed to be getting worse as time goes on.  We discussed how looking at screens can cause eye strain which could in turn cause headaches however nausea and vomiting would not be expected.  In regards to his anxiety symptoms patient reports no significant improvement or decline over the past month.  He still continues to worry and have anxiety.  We discussed the potential of trying a new medication, however patient declined as everything he tries makes him feel more nauseous.  We discussed the benefit of reconnecting with his gastroenterologist/primary care doctor to see if there is an alternative medication he can try to help manage his nausea to a point where he could tolerate medications.  Patient was also referred to a neurologist for workup in relation to his headaches, dizziness, and nausea/vomiting from looking at screens.   Past Psychiatric History: Patient had former psychiatric care in [place] for anxiety and depression though is unsure what it was.  He was trialed on Remeron by his primary care provider with little benefit.  Patient denies ever being involved in therapy, he devise any suicide attempts, and he denies any prior psychiatric hospitalizations.  Patient was started on Prozac and propranolol however discontinued both  in 7 and 3 days respectively due to symptoms of dizziness, nausea, and vomiting.  Patient denies any substance use.  Past Medical History:  Past Medical History:  Diagnosis Date   Acid reflux disease    Constipation    Dyspepsia    H. pylori infection     Past Surgical History:  Procedure Laterality Date   NO PAST SURGERIES      Family Psychiatric History: Denies  Family History:  Family History  Family history unknown: Yes    Social History:  Social History   Socioeconomic History   Marital status: Married    Spouse name: Not on file   Number of children: 1   Years of education: Not on file   Highest education level: Not on file  Occupational History   Occupation: [employer]  Tobacco Use   Smoking status: Never   Smokeless tobacco: Never  Vaping Use   Vaping Use: Never used  Substance and Sexual Activity   Alcohol use: Not Currently   Drug use: Not Currently   Sexual activity: Yes    Birth control/protection: None  Other Topics Concern   Not on file  Social History Narrative   Not on file   Social Determinants of Health   Financial Resource Strain: Not on file  Food Insecurity: Not on file  Transportation Needs: Not on file  Physical Activity: Not  on file  Stress: Not on file  Social Connections: Not on file    Allergies: No Known Allergies  Current Medications: Current Outpatient Medications  Medication Sig Dispense Refill   albuterol (VENTOLIN HFA) 108 (90 Base) MCG/ACT inhaler Inhale 1-2 puffs into the lungs every 6 (six) hours as needed for wheezing or shortness of breath. 18 g 2   FLUoxetine (PROZAC) 10 MG capsule Take 1 capsule (10 mg total) by mouth daily. 30 capsule 2   fluticasone-salmeterol (ADVAIR) 500-50 MCG/ACT AEPB Inhale 1 puff into the lungs in the morning and at bedtime. 60 each 1   ibuprofen (ADVIL) 800 MG tablet Take 1 tablet (800 mg total) by mouth every 8 (eight) hours as needed (pain). 21 tablet 0   montelukast (SINGULAIR)  10 MG tablet Take 10 mg by mouth daily.     pantoprazole (PROTONIX) 40 MG tablet Take 40 mg by mouth daily.     propranolol (INDERAL) 10 MG tablet TAKE 1 TABLET BY MOUTH TWICE A DAY AS NEEDED FOR ANXIETY 60 tablet 0   No current facility-administered medications for this visit.     Musculoskeletal: Strength & Muscle Tone: within normal limits Gait & Station: normal Patient leans: N/A  Psychiatric Specialty Exam: Review of Systems  Blood pressure 122/84, pulse 61, temperature (!) 97.3 F (36.3 C), temperature source Oral, height 5' 6" (1.676 m), weight 123 lb 12.8 oz (56.2 kg).Body mass index is 19.98 kg/m.  General Appearance: Fairly Groomed  Eye Contact:  Minimal  Speech:  Normal Rate and translator present  Volume:  Increased  Mood:  Anxious and Hopeless  Affect:  Congruent  Thought Process:  Goal Directed  Orientation:  Full (Time, Place, and Person)  Thought Content: Logical   Suicidal Thoughts:  No  Homicidal Thoughts:  No  Memory:  NA  Judgement:  Fair  Insight:  Fair  Psychomotor Activity:  Normal  Concentration:  Concentration: Good  Recall:  War of Knowledge: Poor  Language:  unable to assess  Akathisia:  NA    AIMS (if indicated): not done  Assets:  Desire for Improvement Housing Talents/Skills  ADL's:  Intact  Cognition: WNL  Sleep:  Fair   Metabolic Disorder Labs: No results found for: "HGBA1C", "MPG" No results found for: "PROLACTIN" No results found for: "CHOL", "TRIG", "HDL", "CHOLHDL", "VLDL", "LDLCALC" Lab Results  Component Value Date   TSH 1.130 10/01/2020    Therapeutic Level Labs: No results found for: "LITHIUM" No results found for: "VALPROATE" No results found for: "CBMZ"   Screenings: [place] Office Visit from 10/28/2021 in [facility] Visit from 10/01/2020 in [place] [facility]  Total GAD-7 Score 7 0      PHQ2-9    [place] Office Visit from  10/28/2021 in [facility] Visit from 05/29/2021 in [facility] Office Visit from 01/04/2021 in [place] Office Visit from 12/10/2020 in [place] Office Visit from 10/19/2020 in [place]  PHQ-2 Total Score 3 2 0 0 0  PHQ-9 Total Score 16 -- 0 0 --      [place] Office Visit from 10/28/2021 in [facility] from 10/16/2021 in [facility] ED from 06/30/2021 in [facility] [place] No Risk No Risk No Risk  Collaboration of Care: Collaboration of Care:   Patient/Guardian was advised Release of Information must be obtained prior to any record release in order to collaborate their care with an outside provider. Patient/Guardian was advised if they have not already done so to contact the registration department to sign all necessary forms in order for [place] to release information regarding their care.   Consent: Patient/Guardian gives verbal consent for treatment and assignment of benefits for services provided during this visit. Patient/Guardian expressed understanding and agreed to proceed.    Vista Mink, MD 12/02/2021, 12:48 PM

## 2021-12-02 NOTE — Telephone Encounter (Signed)
Referrals for neuro evaluation sent to [facility] and [facility].

## 2022-01-20 ENCOUNTER — Ambulatory Visit (HOSPITAL_COMMUNITY)
Admission: EM | Admit: 2022-01-20 | Discharge: 2022-01-20 | Disposition: A | Discharge status: Home / Self Care | Payer: Commercial Managed Care - HMO | Attending: Emergency Medicine | Admitting: Emergency Medicine

## 2022-01-20 ENCOUNTER — Encounter (HOSPITAL_COMMUNITY): Payer: Self-pay

## 2022-01-20 DIAGNOSIS — K047 Periapical abscess without sinus: Secondary | ICD-10-CM | POA: Diagnosis not present

## 2022-01-20 DIAGNOSIS — K0889 Other specified disorders of teeth and supporting structures: Secondary | ICD-10-CM

## 2022-01-20 MED ORDER — IBUPROFEN 800 MG PO TABS: 800.0000 mg | ORAL_TABLET | Freq: Three times a day (TID) | ORAL | 0 refills | Status: DC | PRN

## 2022-01-20 MED ORDER — AMOXICILLIN-POT CLAVULANATE 875-125 MG PO TABS: 1.0000 | ORAL_TABLET | Freq: Two times a day (BID) | ORAL | 0 refills | Status: DC

## 2022-01-20 MED ORDER — MONTELUKAST SODIUM 10 MG PO TABS: 10.0000 mg | ORAL_TABLET | Freq: Every day | ORAL | 1 refills | Status: DC

## 2022-01-20 NOTE — ED Triage Notes (Signed)
Per interpreter, pt c/o rt upper and lower dental pain for 2wks and now having pain when he eats. States was taking tylenol for pain but not in the past 2 days.

## 2022-01-20 NOTE — Discharge Instructions (Signed)
Gargle with warm salt water Ibuprofen pain medicine as needed for pain You need to follow-up with a dentist take full dose of antibiotics with food

## 2022-01-20 NOTE — ED Provider Notes (Signed)
[place]    CSN: [id number] Arrival date & time: 01/20/22  0803      History   Chief Complaint Chief Complaint  Patient presents with   Dental Pain    HPI Eric Mcbride is a 32 y.o. male.   Interpreter line used.  Patient presents today with right upper tooth pain x 2 weeks.  Patient states that he has been taking ibuprofen with no relief and gargling with water and salt water.  Patient is also asking for [place] to refill his montelukast medication unknown fevers.  Denies any nausea vomiting or diarrhea patient has not seen a dentist for this tooth yet    Past Medical History:  Diagnosis Date   Acid reflux disease    Constipation    Dyspepsia    H. pylori infection     Patient Active Problem List   Diagnosis Date Noted   GAD (generalized anxiety disorder) 10/28/2021   MDD (major depressive disorder) 10/28/2021   History of typhoid fever 05/29/2021   Lower abdominal pain 01/21/2021   Loss of weight 01/21/2021   Gastroesophageal reflux disease without esophagitis 10/01/2020   Black stool 29/52/8413   History of Helicobacter pylori infection 10/01/2020    Past Surgical History:  Procedure Laterality Date   NO PAST SURGERIES         Home Medications    Prior to Admission medications   Medication Sig Start Date End Date Taking? Authorizing Provider  amoxicillin-clavulanate (AUGMENTIN) 875-125 MG tablet Take 1 tablet by mouth every 12 (twelve) hours. 01/20/22  Yes Morley Kos L, NP  montelukast (SINGULAIR) 10 MG tablet Take 1 tablet (10 mg total) by mouth at bedtime. 01/20/22  Yes Marney Setting, NP  albuterol (VENTOLIN HFA) 108 (90 Base) MCG/ACT inhaler Inhale 1-2 puffs into the lungs every 6 (six) hours as needed for wheezing or shortness of breath. 06/30/21   Nyoka Lint, PA-C  fluticasone-salmeterol (ADVAIR) 500-50 MCG/ACT AEPB Inhale 1 puff into the lungs in the morning and at bedtime. 06/30/21   Nyoka Lint, PA-C  ibuprofen (ADVIL) 800  MG tablet Take 1 tablet (800 mg total) by mouth every 8 (eight) hours as needed (pain). 01/20/22   Marney Setting, NP    Family History Family History  Family history unknown: Yes    Social History Social History   Tobacco Use   Smoking status: Never   Smokeless tobacco: Never  Vaping Use   Vaping Use: Never used  Substance Use Topics   Alcohol use: Not Currently   Drug use: Not Currently     Allergies   Patient has no known allergies.   Review of Systems Review of Systems  Constitutional:  Negative for fever.  HENT:  Positive for dental problem and facial swelling. Negative for mouth sores.   Eyes: Negative.   Respiratory: Negative.    Cardiovascular: Negative.   Gastrointestinal: Negative.   Neurological: Negative.      Physical Exam Triage Vital Signs ED Triage Vitals [01/20/22 0831]  Enc Vitals Group     BP 131/89     Pulse Rate 65     Resp 18     Temp 98.4 F (36.9 C)     Temp Source Oral     SpO2 96 %     Weight      Height      Head Circumference      Peak Flow      Pain Score 6     Pain  Loc      Pain Edu?      Excl. in [place]?    No data found.  Updated Vital Signs BP 131/89 (BP Location: Right Arm)   Pulse 65   Temp 98.4 F (36.9 C) (Oral)   Resp 18   SpO2 96%   Visual Acuity Right Eye Distance:   Left Eye Distance:   Bilateral Distance:    Right Eye Near:   Left Eye Near:    Bilateral Near:     Physical Exam Constitutional:      Appearance: Normal appearance.  HENT:     Nose: Nose normal.     Mouth/Throat:     Dentition: Dental tenderness and dental caries present. No dental abscesses.     Tongue: No lesions.     Pharynx: Oropharynx is clear.     Tonsils: No tonsillar exudate or tonsillar abscesses.  Cardiovascular:     Rate and Rhythm: Normal rate.  Pulmonary:     Effort: Pulmonary effort is normal.  Abdominal:     General: Abdomen is flat.  Neurological:     Mental Status: He is alert.      UC Treatments /  Results  Labs (all labs ordered are listed, but only abnormal results are displayed) Labs Reviewed - No data to display  EKG   Radiology No results found.  Procedures Procedures (including critical care time)  Medications Ordered in UC Medications - No data to display  Initial Impression / Assessment and Plan / UC Course  I have reviewed the triage vital signs and the nursing notes.  Pertinent labs & imaging results that were available during my care of the patient were reviewed by me and considered in my medical decision making (see chart for details).     Gargle with warm salt water Ibuprofen pain medicine as needed for pain You need to follow-up with a dentist take full dose of antibiotics with food  Final Clinical Impressions(s) / UC Diagnoses   Final diagnoses:  Pain, dental  Dental infection     Discharge Instructions      Gargle with warm salt water Ibuprofen pain medicine as needed for pain You need to follow-up with a dentist take full dose of antibiotics with food     ED Prescriptions     Medication Sig Dispense Auth. Provider   ibuprofen (ADVIL) 800 MG tablet Take 1 tablet (800 mg total) by mouth every 8 (eight) hours as needed (pain). 21 tablet Morley Kos L, NP   montelukast (SINGULAIR) 10 MG tablet Take 1 tablet (10 mg total) by mouth at bedtime. 30 tablet Morley Kos L, NP   amoxicillin-clavulanate (AUGMENTIN) 875-125 MG tablet Take 1 tablet by mouth every 12 (twelve) hours. 14 tablet Marney Setting, NP      PDMP not reviewed this encounter.   Marney Setting, NP 01/20/22 [phone]

## 2022-01-21 ENCOUNTER — Encounter: Payer: Self-pay | Admitting: *Deleted

## 2022-01-21 NOTE — Congregational Nurse Program (Signed)
  Dept: [phone]   Congregational Nurse Program Note  Date of Encounter: 01/21/2022  Past Medical History: Past Medical History:  Diagnosis Date   Acid reflux disease    Constipation    Dyspepsia    H. pylori infection     Encounter Details:  CNP Questionnaire - 01/21/22 1611       Questionnaire   Ask client: Do you give verbal consent for me to treat you today? Yes    Student Assistance N/A    Location Patient [place]    Visit Setting with Client Phone/Text/Email    Patient Status [place] or [place]    Insurance/Financial Assistance Referral N/A    Medication N/A    Medical Provider Yes    Screening Referrals Made Dental    Medical Referrals Made Dental    Medical Appointment Made Dental    Recently w/o PCP, now 1st time PCP visit completed due to CNs referral or appointment made N/A    Food N/A    Transportation Need transportation assistance    Housing/Utilities N/A    Interpersonal Safety N/A    Interventions [place];Advocate/Support    Abnormal to Normal Screening Since Last CN Visit N/A    Screenings CN Performed N/A    Sent Client to Lab for: N/A    Did client attend any of the following based off CNs referral or appointments made? N/A    ED Visit Averted Yes    Life-Saving Intervention Made N/A            Client contacted this CN today stating he needed to see a dentist.  Client was seen in urgent care yesterday for tooth pain and given an antibiotic.  Client was referred to Dr Hali Marry, but that dentist is not seeing new patients.  I have called [facility] to request an appointment for client.  I will follow up after I speak with dental clinic.   Karene Fry, RN, MSN, [place] Office [phone] Cell

## 2022-01-30 ENCOUNTER — Encounter: Payer: Self-pay | Admitting: *Deleted

## 2022-01-31 NOTE — Congregational Nurse Program (Signed)
  Dept: [phone]   Congregational Nurse Program Note  Date of Encounter: 01/30/2022  Past Medical History: Past Medical History:  Diagnosis Date   Acid reflux disease    Constipation    Dyspepsia    H. pylori infection     Encounter Details:  CNP Questionnaire - 01/30/22 0830       Questionnaire   Ask client: Do you give verbal consent for me to treat you today? Yes    Student Assistance N/A    Location Patient [place]    Visit Setting with Client MD Office    Patient Status Refugee    Insurance Private or [place]    Insurance/Financial Assistance Referral N/A    Medication N/A    Medical Provider Yes    Screening Referrals Made N/A    Medical Referrals Made N/A    Medical Appointment Made Dental    Recently w/o PCP, now 1st time PCP visit completed due to CNs referral or appointment made N/A    Food N/A    Transportation Need transportation assistance;Provided transportation assistance    Housing/Utilities N/A    Interpersonal Safety N/A    Interventions [place];Advocate/Support    Abnormal to Normal Screening Since Last CN Visit N/A    Screenings CN Performed N/A    Sent Client to Lab for: N/A    Did client attend any of the following based off CNs referral or appointments made? N/A    ED Visit Averted Yes    Life-Saving Intervention Made N/A            Accompanied client to dental office for first visit at [facility] dentistry.  Transportation was provided to client.  Taxi ride was provided to client to return home from the dental visit.  Client was assisted by this CN to complete dental paper work.  Client speaks Pashto with some English.  Client tells me that a tooth needs to be removed.  Follow up appointment with the dentist was made for February 14th at 10 am.  Will assure that transportation is provided to client. In addition client asks me to review 2 bills from medical providers.  I did review the bills and educated  the client regarding the contents of the bills.   Will follow up as requested.  Karene Fry, RN, MSN, [place] Office [phone] Cell

## 2022-02-12 ENCOUNTER — Encounter: Payer: Self-pay | Admitting: *Deleted

## 2022-02-12 NOTE — Congregational Nurse Program (Signed)
  Dept: [phone]   Congregational Nurse Program Note  Date of Encounter: 02/12/2022  Past Medical History: Past Medical History:  Diagnosis Date   Acid reflux disease    Constipation    Dyspepsia    H. pylori infection     Encounter Details:  CNP Questionnaire - 02/12/22 0820       Questionnaire   Ask client: Do you give verbal consent for me to treat you today? Yes    Student Assistance N/A    Location Patient [place]    Visit Setting with Client Phone/Text/Email    Patient Status Refugee    Sport and exercise psychologist or [place]    Insurance/Financial Assistance Referral N/A    Medication N/A    Medical Provider Yes    Screening Referrals Made N/A    Medical Referrals Made [facility] PCP/Clinic    Medical Appointment Made N/A    Recently w/o PCP, now 1st time PCP visit completed due to CNs referral or appointment made N/A    Food N/A    Transportation Need transportation assistance    Housing/Utilities N/A    Interpersonal Safety N/A    Interventions [place];Advocate/Support    Abnormal to Normal Screening Since Last CN Visit N/A    Screenings CN Performed N/A    Sent Client to Lab for: N/A    Did client attend any of the following based off CNs referral or appointments made? N/A    ED Visit Averted Yes    Life-Saving Intervention Made N/A            Client reached out to this CN for assistance.  Client would like to visit an MD for a "check on his sperm".  I have reached out for assistance in procuring an MD for client.  Will follow up as soon as I have more information.  Karene Fry, RN, MSN, [place] Office [phone] Cell

## 2022-02-17 ENCOUNTER — Ambulatory Visit (HOSPITAL_BASED_OUTPATIENT_CLINIC_OR_DEPARTMENT_OTHER): Payer: Commercial Managed Care - HMO | Admitting: Psychiatry

## 2022-02-17 ENCOUNTER — Encounter: Payer: Self-pay | Admitting: *Deleted

## 2022-02-17 ENCOUNTER — Encounter (HOSPITAL_COMMUNITY): Payer: Self-pay | Admitting: Psychiatry

## 2022-02-17 VITALS — BP 129/87 | HR 62 | Temp 97.5°F | Ht 66.0 in | Wt 122.8 lb

## 2022-02-17 DIAGNOSIS — F330 Major depressive disorder, recurrent, mild: Secondary | ICD-10-CM

## 2022-02-17 DIAGNOSIS — F411 Generalized anxiety disorder: Secondary | ICD-10-CM | POA: Diagnosis not present

## 2022-02-17 MED ORDER — ESCITALOPRAM OXALATE 5 MG PO TABS: 5.0000 mg | ORAL_TABLET | Freq: Every day | ORAL | 1 refills | Status: DC

## 2022-02-17 MED ORDER — HYDROXYZINE HCL 25 MG PO TABS: 25.0000 mg | ORAL_TABLET | Freq: Three times a day (TID) | ORAL | 1 refills | Status: DC | PRN

## 2022-02-17 NOTE — Progress Notes (Signed)
BH MD/PA/NP OP Progress Note  02/17/2022 10:40 AM Eric Mcbride  MRN:  [id number]  Visit Diagnosis:    ICD-10-CM   1. GAD (generalized anxiety disorder)  F41.1 escitalopram (LEXAPRO) 5 MG tablet    hydrOXYzine (ATARAX) 25 MG tablet    2. Mild episode of recurrent major depressive disorder (HCC)  F33.0 escitalopram (LEXAPRO) 5 MG tablet      Assessment: Eric Mcbride is a 32 y.o. male with a history of anxiety who presented to [place] at [facility] for initial evaluation on 10/28/21.    During initial evaluation patient reported symptoms of depression and anxiety including low mood, anhedonia, amotivation, decreased energy, poor concentration, constant worry, social anxiety, and physical symptoms with anxiety including palpitations, shortness of breath, diaphoresis, and nausea.  He denied any suicidal ideation or thoughts of self-harm along with any history of mania, psychosis, paranoia, or delusions.  Psychosocially patient is an immigrant from [place] and moved here with his brother 2 years ago.  His wife and son are still in [place] and have been unable to come here despite patient wanting them to.  He has a partial understanding of English which has led to him being more isolated.  At this time patient meets criteria for MDD and generalized anxiety disorder he also has a number of traits consistent with social anxiety disorder.  Shahil Mcbride presents for follow-up evaluation. Today, 02/17/22, patient reports a continued decline in his anxiety and depression over the past couple months.  His anxiety can be constant worry throughout the day often over small things unable to get past.  In addition to this patient reports difficulty talking with others as it can lead to irritability and frustration along with head pain after about 5 minutes.  He notes that this is regardless of whether it is over the phone or in person or if it is family versus somebody  new.  Discussed medication options including trying Lexapro and Atarax after going over the risk and benefits.  Plan: - Start Atarax 25 mg TID prn for anxiety - Start Lexapro 5 mg QD - Discontinue Prozac due to adverse side effect - Discontinue propranolol due to adverse side effects - CMP, CBC, and glucose reviewed - Crisis resources discussed - Refer for a neurologist - Follow up in a month   Chief Complaint:  Chief Complaint  Patient presents with   Follow-up   HPI: Patient was initially accompanied by a Investment banker, corporate however we switched over to phone translator halfway through the session as patient was unsatisfied with the translation.  Patient reports that his anxiety and depression symptoms have been consistently getting worse over the last couple months.  He notes that the anxiety symptoms as consistent worry about small things that continues to bother him throughout the whole day and is unable to move past.  On top of that he has periods of increased stress/irritability and head pain that can occur after he is talking to someone for about 5 minutes.  He often and the conversations at that point and denies any physical or verbal aggression.  Patient expresses that the symptoms he experiencing back in [place] however the severity has increased.  The medication he received back in [place] was helpful however he is unable to remember what it was.  We discussed treatment options including a as needed short acting medication like Atarax and a longer acting medication like Lexapro.  Patient was interested in the Atarax and risk and benefits were discussed.  As for the Lexapro he had some hesitancy due to already experiencing GI issues.  After discussing the risk and benefits he agreed to try it and discontinue if his GI symptoms got worse.  Past Psychiatric History: Patient had former psychiatric care in [place] for anxiety and depression though is unsure what it was.  He  was trialed on Remeron by his primary care provider with little benefit.  Patient denies ever being involved in therapy, he devise any suicide attempts, and he denies any prior psychiatric hospitalizations.  Patient was started on Prozac and propranolol however discontinued both in 7 and 3 days respectively due to symptoms of dizziness, nausea, and vomiting.  Patient denies any substance use.  Past Medical History:  Past Medical History:  Diagnosis Date   Acid reflux disease    Constipation    Dyspepsia    H. pylori infection     Past Surgical History:  Procedure Laterality Date   NO PAST SURGERIES      Family Psychiatric History: Denies  Family History:  Family History  Family history unknown: Yes    Social History:  Social History   Socioeconomic History   Marital status: Married    Spouse name: Not on file   Number of children: 1   Years of education: Not on file   Highest education level: Not on file  Occupational History   Occupation: [employer]  Tobacco Use   Smoking status: Never   Smokeless tobacco: Never  Vaping Use   Vaping Use: Never used  Substance and Sexual Activity   Alcohol use: Not Currently   Drug use: Not Currently   Sexual activity: Yes    Birth control/protection: None  Other Topics Concern   Not on file  Social History Narrative   Not on file   Social Determinants of Health   Financial Resource Strain: Not on file  Food Insecurity: Not on file  Transportation Needs: Not on file  Physical Activity: Not on file  Stress: Not on file  Social Connections: Not on file    Allergies: No Known Allergies  Current Medications: Current Outpatient Medications  Medication Sig Dispense Refill   escitalopram (LEXAPRO) 5 MG tablet Take 1 tablet (5 mg total) by mouth daily. 30 tablet 1   hydrOXYzine (ATARAX) 25 MG tablet Take 1 tablet (25 mg total) by mouth every 8 (eight) hours as needed for anxiety. 90 tablet 1   albuterol (VENTOLIN HFA)  108 (90 Base) MCG/ACT inhaler Inhale 1-2 puffs into the lungs every 6 (six) hours as needed for wheezing or shortness of breath. 18 g 2   amoxicillin-clavulanate (AUGMENTIN) 875-125 MG tablet Take 1 tablet by mouth every 12 (twelve) hours. 14 tablet 0   fluticasone-salmeterol (ADVAIR) 500-50 MCG/ACT AEPB Inhale 1 puff into the lungs in the morning and at bedtime. 60 each 1   ibuprofen (ADVIL) 800 MG tablet Take 1 tablet (800 mg total) by mouth every 8 (eight) hours as needed (pain). 21 tablet 0   montelukast (SINGULAIR) 10 MG tablet Take 1 tablet (10 mg total) by mouth at bedtime. 30 tablet 1   No current facility-administered medications for this visit.     Musculoskeletal: Strength & Muscle Tone: within normal limits Gait & Station: normal Patient leans: N/A  Psychiatric Specialty Exam: Review of Systems  There were no vitals taken for this visit.There is no height or weight on file to calculate BMI.  General Appearance: Fairly Groomed  Eye Contact:  Minimal  Speech:  Normal Rate and translator present  Volume:  Increased  Mood:  Anxious and Hopeless  Affect:  Congruent  Thought Process:  Goal Directed  Orientation:  Full (Time, Place, and Person)  Thought Content: Logical   Suicidal Thoughts:  No  Homicidal Thoughts:  No  Memory:  NA  Judgement:  Fair  Insight:  Fair  Psychomotor Activity:  Normal  Concentration:  Concentration: Good  Recall:  [place] of Knowledge: Poor  Language:  unable to assess  Akathisia:  NA    AIMS (if indicated): not done  Assets:  Desire for Improvement Housing Talents/Skills  ADL's:  Intact  Cognition: WNL  Sleep:  Fair   Metabolic Disorder Labs: No results found for: "HGBA1C", "MPG" No results found for: "PROLACTIN" No results found for: "CHOL", "TRIG", "HDL", "CHOLHDL", "VLDL", "LDLCALC" Lab Results  Component Value Date   TSH 1.130 10/01/2020    Therapeutic Level Labs: No results found for: "LITHIUM" No results found for:  "VALPROATE" No results found for: "CBMZ"   Screenings: GAD-7    Flowsheet Row Office Visit from 10/28/2021 in [facility] Office Visit from 10/01/2020 in [place] [facility]  Total GAD-7 Score 7 0      PHQ2-9    [place] Office Visit from 10/28/2021 in [facility] Office Visit from 05/29/2021 in [facility] Office Visit from 01/04/2021 in [place] Office Visit from 12/10/2020 in [place] Office Visit from 10/19/2020 in [place]  PHQ-2 Total Score 3 2 0 0 0  PHQ-9 Total Score 16 -- 0 0 --      [place] [facility] from 01/20/2022 in [facility] [place] from 10/28/2021 in [facility] from 10/16/2021 in [facility] [place] No Risk No Risk No Risk       Collaboration of Care: Collaboration of Care:   Patient/Guardian was advised Release of Information must be obtained prior to any record release in order to collaborate their care with an outside provider. Patient/Guardian was advised if they have not already done so to contact the registration department to sign all necessary forms in order for [place] to release information regarding their care.   Consent: Patient/Guardian gives verbal consent for treatment and assignment of benefits for services provided during this visit. Patient/Guardian expressed understanding and agreed to proceed.    Vista Mink, MD 02/17/2022, 10:40 AM

## 2022-02-17 NOTE — Congregational Nurse Program (Signed)
  Dept: [phone]   Congregational Nurse Program Note  Date of Encounter: 02/17/2022  Past Medical History: Past Medical History:  Diagnosis Date   Acid reflux disease    Constipation    Dyspepsia    H. pylori infection     Encounter Details:  CNP Questionnaire - 02/17/22 0915       Questionnaire   Ask client: Do you give verbal consent for me to treat you today? Yes    Student Assistance N/A    Location Patient [place]    Visit Setting with Client MD Office;Home    Patient Status Refugee    Sport and exercise psychologist or [place]    Insurance/Financial Assistance Referral N/A    Medication N/A    Medical Provider Yes    Screening Referrals Made N/A    Medical Referrals Made N/A    Medical Appointment [place]    Recently w/o PCP, now 1st time PCP visit completed due to CNs referral or appointment made N/A    Food N/A    Transportation Need transportation assistance;Provided transportation assistance    Housing/Utilities N/A    Interpersonal Safety N/A    Interventions Advocate/Support;Navigate Healthcare System    Abnormal to Normal Screening Since Last CN Visit N/A    Screenings CN Performed N/A    Sent Client to Lab for: N/A    Did client attend any of the following based off CNs referral or appointments made? Medical    ED Visit Averted Yes    Life-Saving Intervention Made N/A            Client provided with transportation to and from outpatient behavioral health appointment.  Client has a new medication per our discussion.  I will follow up with client in the next couple days.  Karene Fry, RN, MSN, [place] Office [phone] Cell

## 2022-03-07 ENCOUNTER — Ambulatory Visit (INDEPENDENT_AMBULATORY_CARE_PROVIDER_SITE_OTHER): Payer: Commercial Managed Care - HMO

## 2022-03-07 VITALS — BP 137/82 | HR 69 | Ht 66.0 in | Wt 127.1 lb

## 2022-03-07 DIAGNOSIS — F411 Generalized anxiety disorder: Secondary | ICD-10-CM | POA: Diagnosis not present

## 2022-03-07 DIAGNOSIS — J45909 Unspecified asthma, uncomplicated: Secondary | ICD-10-CM

## 2022-03-07 DIAGNOSIS — Z3141 Encounter for fertility testing: Secondary | ICD-10-CM

## 2022-03-07 DIAGNOSIS — R252 Cramp and spasm: Secondary | ICD-10-CM | POA: Diagnosis not present

## 2022-03-07 DIAGNOSIS — K30 Functional dyspepsia: Secondary | ICD-10-CM

## 2022-03-07 NOTE — Patient Instructions (Addendum)
?? ????? ???? ???? ???? ??? ???? ?? ?? ?? ????? ? ??????? ????? ?????. ?? ?? ??? ???:  ?? ??? ??? ?? ? ?????? ??? ? ????? ????? ? ???? ?????? ??? ??? ??? ?? ????? ? ???? ??? ??? ????? ????. ?? ??? ?? ??? ?? ??? ??? ?? ?? ??? ???? ?? ??????? ????? ??.  ????? ????? ?? ????? ? ??? ??? ????? ?? ?? ?????? ?? ????? ?????. ??? ???? ?? ????? ? ???? ??? ?? ? ??????? ??? ?? ??? ??? ?? ?????? ??? ?? ?? ?? ?? ??.  ??? ?? ??? ? ????? ???? ?? ??? ????? ? ????? ?????? ??? ??.      ?????:   6 ??????    ??? ?? ?? ???? ??? ???? ?? ????? ?? ??? ??. ??????? ???? ???? ?????? ?? ?? [phone] ????? ????? ?? ???? ???? ?????? ?? ??????? ???. ? ??? ???? ???? ??? ? ?????? ??? ???? ? ???? ?? 9 ??? ??? ?? 4 ??? ???? ??? ??? ?? ??? ???? ??? ?? 24/7 ???? ???. ?? ? ??????? ?? ???? ??? ??????? ? ?????? ???? ????? ?? ??? ???? ?? ? ????? ????? ? ????????? ?? ?????? ?????? ????. ?? ???? ? ????? ??? ????? ?? ????? ??? ? ??????? ???? ??? ???? ???? ??? ??????? ?? ??? ????? ?? ??? ?? ??? ?? ?????? ??????.  ????? ? ??????? ??? ??? ???? ???? ????? ????. ????? ? ???? ?? ????!  ???? ?????? MD ? ????? ?????? ????? ????? ????  Thank you, [name] for allowing [place] to provide your care today. Today we discussed:  I think working with behavioral health to help with anxiety will help with your gastrointestinal symptoms. I do not think more medicine or testing will be helpful at this time.  Lets see you back in a few months for your leg cramping. We can check your blood work and vitamin levels at that time if it is not improved.  I think the next step for infertility is getting your wife tested.      Follow up: 6 months     We look forward to seeing you next time. Please call our clinic at [phone] if you have any questions or concerns. The best time to call is Monday-Friday from 9am-4pm, but there is someone available 24/7. If after hours or the weekend, call the main hospital number and ask for the Internal  Medicine Resident On-Call. If you need medication refills, please notify your pharmacy one week in advance and they will send [place] a request.   Thank you for trusting me with your care. Wishing you the best!   [place]

## 2022-03-07 NOTE — Progress Notes (Unsigned)
MDDanxiety Follows with behavioral health Plan: - Start Atarax 25 mg TID prn for anxiety - Start Lexapro 5 mg QD - Discontinue Prozac due to adverse side effect - Discontinue propranolol due to adverse side effects - CMP, CBC, and glucose reviewed - Crisis resources discussed - Refer for a neurologist - Follow up in a month  Asthma Advair(fluticasone salmaterol) singulair

## 2022-03-10 DIAGNOSIS — R252 Cramp and spasm: Secondary | ICD-10-CM | POA: Insufficient documentation

## 2022-03-10 DIAGNOSIS — Z3141 Encounter for fertility testing: Secondary | ICD-10-CM | POA: Insufficient documentation

## 2022-03-10 DIAGNOSIS — J45909 Unspecified asthma, uncomplicated: Secondary | ICD-10-CM | POA: Insufficient documentation

## 2022-03-10 DIAGNOSIS — K30 Functional dyspepsia: Secondary | ICD-10-CM | POA: Insufficient documentation

## 2022-03-10 NOTE — Assessment & Plan Note (Addendum)
Has followed with Dr. Havery Moros in the past for symptoms including poor appetite, early satiety, bloating, consitpation, reflux. Patient has had extensive work up including CT A/P, gastric emptying study, EGD and all were normal. No evidence of H pylori on EGD or on H pylor breath test x 2. Has trialed protonix, mirilax, zofran, remeron without any benefit. Do not think additional testing is warranted at this time. Patient does continue to endorse constipation and bloating. Says that his stools are not particularly hard and usually has a bowel movement per day, but yesterday for example attempted to have a bowel movement x4 with no success. He now mainly eats milk and bread. No concern for celiac or lactose intolerance given normal appearing EGD, no diarrhea, and the fact that he says eating only milk and bread has helped. Discussed with patient that he could try increasing mirilax and could also continue protonix. Could consider amitriptyline in the future. Do think much of this is anxiety driven. Discussed with patient optimizing anxiety treatment prior to any other testing or GI related meds.

## 2022-03-10 NOTE — Assessment & Plan Note (Signed)
Patient endorses that he moved to the [place] from [place] 2.5 years ago, but has been married now for 6.5 years. During the 4 years with his wife in [place] unsuccessful in becoming pregnant. His wife has never had fertility testing. He saw multiple physicians, some said his sperm was normal and some say it was "weak". Discussed that normally any testing is done by getting the wife to a fertility specialist for testing. He says this is not an option as she is not to come to the [place] but they are meeting in another country. He says he only came to the office to get his sperm tested.

## 2022-03-10 NOTE — Assessment & Plan Note (Signed)
Patient endorsed R>L pain from his knee to his ankle along the posterior leg especially when he drives. Says there is some aching when he walks. Applying pressure to the legs and massaging helps. On exam DP and PT pulses are 2+ bilaterally, skin is not shiny and there is good hair growth so no c/f claudication and PAD. Discussed with patient to give it some time and that his limited diet may be affecting this. Discussed drinking plenty of water and broadening diet to ensure electrolytes are replenished in his body, both of which are common causes of cramps. Can consider B12, vitamin D and iron studies in the future if cramps do not resolve.

## 2022-03-10 NOTE — Assessment & Plan Note (Signed)
Patient currently denies significant cough, says he has stray cough with some phlegm with allergies. Denies sob and wheezing. Minimal chest tightness. No night time awakenings. He does have some nasal drainage. Could consider flonase. Continue advair and singulair.

## 2022-03-10 NOTE — Assessment & Plan Note (Addendum)
Following with behavioral health. Therapies include atarax, lexapro. Will defer med management and any therapy to them.

## 2022-03-10 NOTE — Progress Notes (Unsigned)
New Patient Office Visit  Subjective    Patient ID: Eric Mcbride, male    DOB: February 10, 1990  Age: 32 y.o. MRN: [id number]  CC:  Chief Complaint  Patient presents with   Leg Pain    Right leg pain   semen    Wants to get semen tested    HPI Eric Mcbride presents to establish care. Please see A&P for HPI information.  Outpatient Encounter Medications as of 03/07/2022  Medication Sig   albuterol (VENTOLIN HFA) 108 (90 Base) MCG/ACT inhaler Inhale 1-2 puffs into the lungs every 6 (six) hours as needed for wheezing or shortness of breath.   amoxicillin-clavulanate (AUGMENTIN) 875-125 MG tablet Take 1 tablet by mouth every 12 (twelve) hours.   escitalopram (LEXAPRO) 5 MG tablet Take 1 tablet (5 mg total) by mouth daily.   fluticasone-salmeterol (ADVAIR) 500-50 MCG/ACT AEPB Inhale 1 puff into the lungs in the morning and at bedtime.   hydrOXYzine (ATARAX) 25 MG tablet Take 1 tablet (25 mg total) by mouth every 8 (eight) hours as needed for anxiety.   ibuprofen (ADVIL) 800 MG tablet Take 1 tablet (800 mg total) by mouth every 8 (eight) hours as needed (pain).   montelukast (SINGULAIR) 10 MG tablet Take 1 tablet (10 mg total) by mouth at bedtime.   No facility-administered encounter medications on file as of 03/07/2022.    Past Medical History:  Diagnosis Date   Acid reflux disease    Constipation    Dyspepsia    H. pylori infection     Past Surgical History:  Procedure Laterality Date   NO PAST SURGERIES      Family History  Family history unknown: Yes    Social History   Socioeconomic History   Marital status: Married    Spouse name: Not on file   Number of children: 1   Years of education: Not on file   Highest education level: Not on file  Occupational History   Occupation: [employer]  Tobacco Use   Smoking status: Never   Smokeless tobacco: Never  Vaping Use   Vaping Use: Never used  Substance and Sexual Activity   Alcohol use: Not Currently    Drug use: Not Currently   Sexual activity: Yes    Birth control/protection: None  Other Topics Concern   Not on file  Social History Narrative   Not on file   Social Determinants of Health   Financial Resource Strain: Not on file  Food Insecurity: Not on file  Transportation Needs: Not on file  Physical Activity: Not on file  Stress: Not on file  Social Connections: Not on file  Intimate Partner Violence: Not on file    Review of Systems  All other systems reviewed and are negative.       Objective    BP 137/82 (BP Location: Left Arm, Patient Position: Sitting, Cuff Size: Normal)   Pulse 69   Ht '5\' 6"'$  (1.676 m)   Wt 127 lb 1.6 oz (57.7 kg)   SpO2 97%   BMI 20.51 kg/m   Physical Exam Constitutional:      General: He is not in acute distress.    Appearance: Normal appearance. He is normal weight.  Eyes:     General: No scleral icterus.    Conjunctiva/sclera: Conjunctivae normal.  Cardiovascular:     Rate and Rhythm: Normal rate and regular rhythm.     Pulses: Normal pulses.     Heart sounds: Normal heart sounds. No  murmur heard.    No gallop.  Pulmonary:     Effort: Pulmonary effort is normal. No respiratory distress.     Breath sounds: Normal breath sounds. No wheezing or rales.  Musculoskeletal:     Right lower leg: No edema.     Left lower leg: No edema.  Skin:    General: Skin is warm and dry.     Capillary Refill: Capillary refill takes less than 2 seconds.  Neurological:     Mental Status: He is alert.  Psychiatric:        Mood and Affect: Mood normal.        Behavior: Behavior normal.        Thought Content: Thought content normal.     {Labs (Optional):23779}    Assessment & Plan:   Problem List Items Addressed This Visit       Respiratory   Asthma    Patient currently denies significant cough, says he has stray cough with some phlegm with allergies. Denies sob and wheezing. Minimal chest tightness. No night time awakenings. He does  have some nasal drainage. Could consider flonase. Continue advair and singulair.        Digestive   Functional dyspepsia - Primary    Has followed with Dr. Havery Moros in the past for symptoms including poor appetite, early satiety, bloating, consitpation, reflux. Patient has had extensive work up including CT A/P, gastric emptying study, EGD and all were normal. No evidence of H pylori on EGD or on H pylor breath test x 2. Has trialed protonix, mirilax, zofran, remeron without any benefit. Do not think additional testing is warranted at this time. Patient does continue to endorse constipation and bloating. Says that his stools are not particularly hard and usually has a bowel movement per day, but yesterday for example attempted to have a bowel movement x4 with no success. He now mainly eats milk and bread. No concern for celiac or lactose intolerance given normal appearing EGD, no diarrhea, and the fact that he says eating only milk and bread has helped. Discussed with patient that he could try increasing mirilax and could also continue protonix. Could consider amitriptyline in the future. Do think much of this is anxiety driven. Discussed with patient optimizing anxiety treatment prior to any other testing or GI related meds.        Other   GAD (generalized anxiety disorder)    Following with behavioral health. Therapies include atarax, lexapro. Will defer med management and any therapy to them.      Leg cramping    Patient endorsed R>L pain from his knee to his ankle along the posterior leg especially when he drives. Says there is some aching when he walks. Applying pressure to the legs and massaging helps. On exam DP and PT pulses are 2+ bilaterally, skin is not shiny and there is good hair growth so no c/f claudication and PAD. Discussed with patient to give it some time and that his limited diet may be affecting this. Discussed drinking plenty of water and broadening diet to ensure electrolytes  are replenished in his body, both of which are common causes of cramps. Can consider B12, vitamin D and iron studies in the future if cramps do not resolve.      Encounter for sperm count for fertility testing    Patient endorses that he moved to the [place] from [place] 2.5 years ago, but has been married now for 6.5 years. During the 4 years with  his wife in [place] unsuccessful in becoming pregnant. His wife has never had fertility testing. He saw multiple physicians, some said his sperm was normal and some say it was "weak". Discussed that normally any testing is done by getting the wife to a fertility specialist for testing. He says this is not an option as she is not to come to the [place] but they are meeting in another country. He says he only came to the office to get his sperm tested.       No follow-ups on file.   Iona Coach, MD

## 2022-03-12 NOTE — Progress Notes (Signed)
Internal Medicine Clinic Attending  Case discussed with the resident at the time of the visit.  We reviewed the resident's history and exam and pertinent patient test results.  I agree with the assessment, diagnosis, and plan of care documented in the resident's note.  

## 2022-03-25 ENCOUNTER — Encounter: Payer: Self-pay | Admitting: *Deleted

## 2022-03-25 NOTE — Congregational Nurse Program (Signed)
  Dept: [phone]   Congregational Nurse Program Note  Date of Encounter: 03/25/2022  Past Medical History: Past Medical History:  Diagnosis Date   Acid reflux disease    Constipation    Dyspepsia    H. pylori infection     Encounter Details:  CNP Questionnaire - 03/25/22 1003       Questionnaire   Ask client: Do you give verbal consent for me to treat you today? Yes    Student Assistance N/A    Location Patient [place]    Visit Setting with Client Phone/Text/Email    Patient Status [place] or [place]    Insurance/Financial Assistance Referral N/A    Medication N/A    Medical Provider Yes    Screening Referrals Made N/A    Medical Referrals Made N/A    Medical Appointment Made N/A    Recently w/o PCP, now 1st time PCP visit completed due to CNs referral or appointment made N/A    Food N/A    Transportation Need transportation assistance    Housing/Utilities N/A    Interpersonal Safety N/A    Interventions Advocate/Support;Navigate Healthcare System    Abnormal to Normal Screening Since Last CN Visit N/A    Screenings CN Performed N/A    Sent Client to Lab for: N/A    Did client attend any of the following based off CNs referral or appointments made? N/A    ED Visit Averted Yes    Life-Saving Intervention Made N/A            Client contacted this CN to request that I cancel all appointments for the next 2-3 months.  Client reports that he will be out of the country for the next 2-3 months.  I have cancelled the behavioral health appointment that was scheduled on March 27.  I have texted client to let him know.  I will follow up with client as requested.  Karene Fry, RN, MSN, [place] [facility] [phone] Cell

## 2022-04-02 ENCOUNTER — Ambulatory Visit (HOSPITAL_COMMUNITY): Payer: Commercial Managed Care - HMO | Admitting: Psychiatry

## 2022-08-14 ENCOUNTER — Emergency Department
Admission: EM | Admit: 2022-08-14 | Discharge: 2022-08-15 | Disposition: A | Discharge status: Home / Self Care | Payer: 59 | Attending: Emergency Medicine | Admitting: Emergency Medicine

## 2022-08-14 DIAGNOSIS — Z760 Encounter for issue of repeat prescription: Secondary | ICD-10-CM | POA: Insufficient documentation

## 2022-08-14 DIAGNOSIS — J4541 Moderate persistent asthma with (acute) exacerbation: Secondary | ICD-10-CM | POA: Insufficient documentation

## 2022-08-14 MED ORDER — DEXAMETHASONE SODIUM PHOSPHATE 10 MG/ML IJ SOLN (WRAP) - FOR ORAL USE
10.0000 mg | Freq: Once | INTRAMUSCULAR | Status: AC
Start: 2022-08-14 — End: 2022-08-14
  Administered 2022-08-14: 10 mg via ORAL
  Filled 2022-08-14: qty 1

## 2022-08-14 MED ORDER — ALBUTEROL SULFATE (2.5 MG/3ML) 0.083% IN NEBU
5.0000 mg | INHALATION_SOLUTION | Freq: Once | RESPIRATORY_TRACT | Status: AC
Start: 2022-08-14 — End: 2022-08-14
  Administered 2022-08-14: 5 mg via RESPIRATORY_TRACT
  Filled 2022-08-14: qty 6

## 2022-08-14 NOTE — ED Provider Notes (Signed)
EMERGENCY DEPARTMENT HISTORY AND PHYSICAL EXAM     None        Date: 08/14/2022  Patient Name: Ryan Moore    History of Presenting Illness     Chief Complaint   Patient presents with    Shortness of Breath    Wheezing       History Provided By: patient    History: Ryan Moore is a 32 y.o. male presenting to the ED with moderate severity, acute on chronic wheezing and shortness of breath for the past few days.  Patient has history of asthma and has previously been prescribed montelukast and albuterol.  He does not have either of these medications for the past several months.  Denies any chest pain.  No smoking or drugs.  No history of heart problems or PE.  No sick contacts.      PCP: Pcp, None, MD  SPECIALISTS:    Current Medications[1]    Past History     Past Medical History:  Past Medical History:   Diagnosis Date    Asthma        Past Surgical History:  History reviewed. No pertinent surgical history.    Family History:  History reviewed. No pertinent family history.    Social History:  Social History[2]    Allergies:  Allergies[3]    Review of Systems     Review of Systems: All pertinent systems reviewed and negative.         Physical Exam   BP (!) 148/99   Pulse 70   Temp 98.2 F (36.8 C) (Oral)   Resp 20   Wt 70.8 kg   SpO2 96%     Constitutional: Vital signs reviewed. Well appearing. No distress.  Head: Normocephalic, atraumatic  Eyes: Conjunctiva and sclera are normal.  No injection or discharge.  Ears, Nose, Throat:  Normal external examination of the nose and ears. No throat or oropharyngeal swelling or erythema. Midline uvula. Mucous membranes moist.  Neck: Normal range of motion. Supple, no meningeal signs. Trachea midline. No stridor. No JVD  Respiratory/Chest: + Diffuse mild expiratory wheeze with good air movement.  Speaking full sentences without difficulty no respiratory distress.   Cardiovascular: Regular rate and rhythm. No murmurs.  Abdomen:  Bowel sounds intact. No rebound or  guarding. Soft.  Non-tender.  Back: No CVA tenderness to percussion. No focal tenderness.  Upper Extremity:  No edema. No cyanosis. Bilateral radial pulses intact and equal.   Lower Extremity:  No edema. No cyanosis. Bilateral calves symmetrical and non-tender. Bilateral DP, PT pulses intact and equal.  Skin: Warm and dry. No rash.  Neuro: Cranial nerves grossly intact.  Moves all extremities spontaneously.  Psychiatric: Normal affect.  Normal insight.      Diagnostic Study Results     Labs -     Results       Procedure Component Value Units Date/Time    COVID-19 (SARS-CoV-2) and Influenza A/B, NAA (Liat Rapid) [[id number]]  (Normal) Collected: 08/14/22 2337    Specimen: Swab from Anterior Nares Updated: 08/15/22 0001     SARS-CoV-2 (COVID-19) RNA Not Detected     Influenza A RNA Not Detected     Influenza B RNA Not Detected    Narrative:      A result of "Detected" indicates POSITIVE for the presence of viral RNA  A result of "Not Detected" indicates NEGATIVE for the presence of viral RNA    Test performed using the Roche cobas Liat SARS-CoV-2 &  Influenza A/B assay. This is a multiplex real-time RT-PCR assay for the detection of SARS-CoV-2, influenza A, and influenza B virus RNA. Viral nucleic acids may persist in vivo, independent of viability. Detection of viral nucleic acid does not imply the presence of infectious virus, or that virus nucleic acid is the cause of clinical symptoms. Negative results do not preclude SARS-CoV-2, influenza A, and/or influenza B infection and should not be used as the sole basis for diagnosis, treatment or other patient management decisions. Invalid results may be due to inhibiting substances in the specimen and recollection should occur.             Radiologic Studies -   Radiology Results (24 Hour)       ** No results found for the last 24 hours. **        .    Medical Decision Making   I am the first provider for this patient.    I reviewed the vital signs, available nursing  notes, medical records, past medical history, past surgical history, family history and social history.    Vital Signs-Reviewed the patient's vital signs.   Patient Vitals for the past 12 hrs:   BP Temp Pulse Resp   08/14/22 2317 (!) 148/99 98.2 F (36.8 C) 70 20                ED Course:     1223 -wheezing resolved.  Patient feels completely better.  Discussed results with pt and counseled on diagnosis, f/u plans, medication use, supportive care, and signs and symptoms when to return to ED immediately. Pt voices understanding and agreement with plan. All questions and concerns addressed.         Provider Notes: Doubt bacterial pneumonia, ACS, PE, acute CHF, cardiac tamponade, pneumothorax, or other acutely life threatening etiology of symptoms.  Consider checking chest x-ray, but given lung sounds intact throughout all fields and no crackles, hypoxia, or fever, I feel he does not need x-ray.  Shared decision making used.  Suspect moderate severity, acute on chronic exacerbation of asthma in the setting of not having any access to albuterol or montelukast.  Wheezing resolved after neb treatment in the ED.  Also given dose of Decadron.  I have given him refills for albuterol for home.  Also refill for montelukast and Advair.  Will also give course of steroids.  Patient is visiting from [place], will follow-up with his doctor when he returns home.  Reviewed supportive care and return precautions.            Diagnosis     Clinical Impression:   1. Moderate persistent asthma with exacerbation    2. Medication refill        Treatment Plan:   ED Disposition       ED Disposition   Discharge    Condition   --    Date/Time   Fri Aug 15, 2022 12:23 AM    Comment   Neils Hudlow discharge to home/self care.    Condition at disposition: Stable                   _______________________________    This note was generated by the Epic EMR system/ Dragon speech recognition and may contain inherent errors or omissions not  intended by the user. Grammatical errors, random word insertions, deletions and pronoun errors  are occasional consequences of this technology due to software limitations. Not all errors are caught or corrected. If  there are questions or concerns about the content of this note or information contained within the body of this dictation they should be addressed directly with the author for clarification.      Attestations: This note is prepared by Lynnea Ferrier, MD    _______________________________         [1]   No current facility-administered medications for this encounter.     Current Outpatient Medications   Medication Sig Dispense Refill    albuterol (PROVENTIL) (2.5 MG/3ML) 0.083% nebulizer solution Take 3 mLs (2.5 mg) by nebulization every 4 (four) hours as needed for Wheezing or Shortness of Breath 100 mL 0    albuterol sulfate HFA (PROVENTIL) 108 (90 Base) MCG/ACT inhaler Inhale 2 puffs into the lungs every 4 (four) hours as needed for Wheezing or Shortness of Breath Dispense with spacer 1 each 0    fluticasone-salmeterol (ADVAIR DISKUS) 100-50 MCG/ACT Aerosol Pwdr, Breath Activated Inhale 1 puff into the lungs daily 60 each 0    montelukast (SINGULAIR) 10 MG tablet Take 1 tablet (10 mg) by mouth nightly 60 tablet 0    Nebulizer Misc Dispense one nebulizer machine to include mask and tubing 1 each 0    pantoprazole (PROTONIX) 40 MG tablet Take 1 tablet (40 mg) by mouth daily      predniSONE (DELTASONE) 20 MG tablet Take 2 tablets (40 mg) by mouth daily for 5 days 10 tablet 0   [2]   Social History  Tobacco Use    Smoking status: Never    Smokeless tobacco: Never   Vaping Use    Vaping status: Never Used   Substance Use Topics    Alcohol use: Never    Drug use: Never   [3] No Known Allergies       Maryella Shivers, MD  08/15/22 [phone]

## 2022-08-15 LAB — COVID-19 (SARS-COV-2) & INFLUENZA  A/B, NAA (ROCHE LIAT)
Influenza A RNA: NOT DETECTED
Influenza B RNA: NOT DETECTED
SARS-CoV-2 (COVID-19) RNA: NOT DETECTED

## 2022-08-15 MED ORDER — ALBUTEROL SULFATE (2.5 MG/3ML) 0.083% IN NEBU
2.5000 mg | INHALATION_SOLUTION | RESPIRATORY_TRACT | 0 refills | Status: AC | PRN
Start: 2022-08-14 — End: 2022-09-13

## 2022-08-15 MED ORDER — ALBUTEROL SULFATE HFA 108 (90 BASE) MCG/ACT IN AERS
2.0000 | INHALATION_SPRAY | RESPIRATORY_TRACT | 0 refills | Status: AC | PRN
Start: 2022-08-14 — End: 2022-09-13

## 2022-08-15 MED ORDER — MONTELUKAST SODIUM 10 MG PO TABS
10.0000 mg | ORAL_TABLET | Freq: Every evening | ORAL | 0 refills | Status: AC
Start: 2022-08-14 — End: 2022-10-13

## 2022-08-15 MED ORDER — NEBULIZER MISC
0 refills | Status: AC
Start: 2022-08-15 — End: ?

## 2022-08-15 MED ORDER — FLUTICASONE-SALMETEROL 100-50 MCG/ACT IN AEPB
1.0000 | INHALATION_SPRAY | Freq: Every day | RESPIRATORY_TRACT | 0 refills | Status: AC
Start: 2022-08-15 — End: ?

## 2022-08-15 MED ORDER — PREDNISONE 20 MG PO TABS
40.0000 mg | ORAL_TABLET | Freq: Every day | ORAL | 0 refills | Status: AC
Start: 2022-08-14 — End: 2022-08-19

## 2022-10-05 ENCOUNTER — Other Ambulatory Visit: Payer: Self-pay

## 2022-10-05 ENCOUNTER — Encounter (HOSPITAL_COMMUNITY): Payer: Self-pay | Admitting: Emergency Medicine

## 2022-10-05 ENCOUNTER — Ambulatory Visit (HOSPITAL_COMMUNITY)
Admission: EM | Admit: 2022-10-05 | Discharge: 2022-10-05 | Disposition: A | Discharge status: Home / Self Care | Payer: Managed Care, Other (non HMO) | Attending: Emergency Medicine | Admitting: Emergency Medicine

## 2022-10-05 DIAGNOSIS — H66003 Acute suppurative otitis media without spontaneous rupture of ear drum, bilateral: Secondary | ICD-10-CM

## 2022-10-05 MED ORDER — DOXYCYCLINE HYCLATE 100 MG PO CAPS: 100.0000 mg | ORAL_CAPSULE | Freq: Two times a day (BID) | ORAL | 0 refills | Status: DC

## 2022-10-05 NOTE — Discharge Instructions (Signed)
Start taking doxycyline twice daily for 10 days for ear infection. Take Tylenol and Ibuprofen as needed for pain. Return here as needed.

## 2022-10-05 NOTE — ED Provider Notes (Signed)
[facility]    CSN: [id number] Arrival date & time: 10/05/22  1538      History   Chief Complaint Chief Complaint  Patient presents with   Ear Fullness    HPI Eric Mcbride is a 32 y.o. male.   Patient presents with right ear fullness that began yesterday making it difficult to hear.  Denies congestion, cough, fever, headaches, and dizziness.   Ear Fullness Pertinent negatives include no headaches.    Past Medical History:  Diagnosis Date   Acid reflux disease    Constipation    Dyspepsia    H. pylori infection     Patient Active Problem List   Diagnosis Date Noted   Functional dyspepsia 03/10/2022   Leg cramping 03/10/2022   Asthma 03/10/2022   Encounter for sperm count for fertility testing 03/10/2022   GAD (generalized anxiety disorder) 10/28/2021   MDD (major depressive disorder) 10/28/2021   History of typhoid fever 05/29/2021   Lower abdominal pain 01/21/2021   Loss of weight 01/21/2021   Gastroesophageal reflux disease without esophagitis 10/01/2020   Black stool 10/01/2020   History of Helicobacter pylori infection 10/01/2020    Past Surgical History:  Procedure Laterality Date   NO PAST SURGERIES         Home Medications    Prior to Admission medications   Medication Sig Start Date End Date Taking? Authorizing Provider  doxycycline (VIBRAMYCIN) 100 MG capsule Take 1 capsule (100 mg total) by mouth 2 (two) times daily. 10/05/22  Yes Wynonia Lawman A, NP  albuterol (VENTOLIN HFA) 108 (90 Base) MCG/ACT inhaler Inhale 1-2 puffs into the lungs every 6 (six) hours as needed for wheezing or shortness of breath. 06/30/21   [place] Lennox, PA-C  escitalopram (LEXAPRO) 5 MG tablet Take 1 tablet (5 mg total) by mouth daily. 02/17/22   Stasia Cavalier, MD  fluticasone-salmeterol (ADVAIR) 500-50 MCG/ACT AEPB Inhale 1 puff into the lungs in the morning and at bedtime. 06/30/21   [place] Lennox, PA-C  hydrOXYzine (ATARAX) 25 MG tablet Take 1 tablet  (25 mg total) by mouth every 8 (eight) hours as needed for anxiety. 02/17/22   Stasia Cavalier, MD  ibuprofen (ADVIL) 800 MG tablet Take 1 tablet (800 mg total) by mouth every 8 (eight) hours as needed (pain). 01/20/22   Coralyn Mark, NP  montelukast (SINGULAIR) 10 MG tablet Take 1 tablet (10 mg total) by mouth at bedtime. 01/20/22   Coralyn Mark, NP    Family History Family History  Family history unknown: Yes    Social History Social History   Tobacco Use   Smoking status: Never   Smokeless tobacco: Never  Vaping Use   Vaping status: Never Used  Substance Use Topics   Alcohol use: Not Currently   Drug use: Not Currently     Allergies   Augmentin [amoxicillin-pot clavulanate]   Review of Systems Review of Systems  Constitutional:  Negative for fever.  HENT:  Positive for ear pain. Negative for congestion.   Respiratory:  Negative for choking.   Neurological:  Negative for dizziness and headaches.     Physical Exam Triage Vital Signs ED Triage Vitals  Encounter Vitals Group     BP 10/05/22 1600 132/87     Systolic BP Percentile --      Diastolic BP Percentile --      Pulse Rate 10/05/22 1600 70     Resp 10/05/22 1600 16     Temp 10/05/22 1600  98.3 F (36.8 C)     Temp Source 10/05/22 1600 Oral     SpO2 10/05/22 1600 98 %     Weight --      Height --      Head Circumference --      Peak Flow --      Pain Score 10/05/22 1604 0     Pain Loc --      Pain Education --      Exclude from Growth Chart --    No data found.  Updated Vital Signs BP 132/87 (BP Location: Right Arm)   Pulse 70   Temp 98.3 F (36.8 C) (Oral)   Resp 16   SpO2 98%   Visual Acuity Right Eye Distance:   Left Eye Distance:   Bilateral Distance:    Right Eye Near:   Left Eye Near:    Bilateral Near:     Physical Exam Vitals and nursing note reviewed.  Constitutional:      General: He is awake. He is not in acute distress.    Appearance: Normal appearance. He is  well-developed and well-groomed. He is not ill-appearing, toxic-appearing or diaphoretic.  HENT:     Right Ear: There is impacted cerumen.     Left Ear: There is impacted cerumen.  Neurological:     Mental Status: He is alert.  Psychiatric:        Behavior: Behavior is cooperative.      UC Treatments / Results  Labs (all labs ordered are listed, but only abnormal results are displayed) Labs Reviewed - No data to display  EKG   Radiology No results found.  Procedures Procedures (including critical care time)  Medications Ordered in UC Medications - No data to display  Initial Impression / Assessment and Plan / UC Course  I have reviewed the triage vital signs and the nursing notes.  Pertinent labs & imaging results that were available during my care of the patient were reviewed by me and considered in my medical decision making (see chart for details).     Patient presented with right ear fullness that began yesterday making it difficult to hear.  Upon assessment patient has bilateral impacted cerumen.  Upon reassessment after ear irrigation, TMs appear erythematous and bulging.  Prescribed doxycycline for bilateral otitis media due to unspecified allergy to Augmentin.  Discussed follow-up and return precautions. Final Clinical Impressions(s) / UC Diagnoses   Final diagnoses:  Non-recurrent acute suppurative otitis media of both ears without spontaneous rupture of tympanic membranes     Discharge Instructions      Start taking doxycyline twice daily for 10 days for ear infection. Take Tylenol and Ibuprofen as needed for pain. Return here as needed.      ED Prescriptions     Medication Sig Dispense Auth. Provider   doxycycline (VIBRAMYCIN) 100 MG capsule Take 1 capsule (100 mg total) by mouth 2 (two) times daily. 20 capsule Wynonia Lawman A, NP      PDMP not reviewed this encounter.   Wynonia Lawman A, NP 10/05/22 1700

## 2022-10-05 NOTE — ED Triage Notes (Signed)
Pt states he is feeling fullness on his right ear since yesterday making difficult to hear though it.

## 2022-10-06 ENCOUNTER — Other Ambulatory Visit (HOSPITAL_COMMUNITY): Payer: Self-pay | Admitting: Psychiatry

## 2022-10-06 DIAGNOSIS — F330 Major depressive disorder, recurrent, mild: Secondary | ICD-10-CM

## 2022-10-06 DIAGNOSIS — F411 Generalized anxiety disorder: Secondary | ICD-10-CM

## 2022-10-14 ENCOUNTER — Other Ambulatory Visit (HOSPITAL_COMMUNITY): Payer: Self-pay | Admitting: Psychiatry

## 2022-10-14 DIAGNOSIS — F330 Major depressive disorder, recurrent, mild: Secondary | ICD-10-CM

## 2022-10-14 DIAGNOSIS — F411 Generalized anxiety disorder: Secondary | ICD-10-CM

## 2022-10-20 ENCOUNTER — Ambulatory Visit (HOSPITAL_COMMUNITY)
Admission: EM | Admit: 2022-10-20 | Discharge: 2022-10-20 | Disposition: A | Discharge status: Home / Self Care | Payer: Commercial Managed Care - HMO | Attending: Emergency Medicine | Admitting: Emergency Medicine

## 2022-10-20 ENCOUNTER — Encounter (HOSPITAL_COMMUNITY): Payer: Self-pay

## 2022-10-20 DIAGNOSIS — F418 Other specified anxiety disorders: Secondary | ICD-10-CM

## 2022-10-20 DIAGNOSIS — J4541 Moderate persistent asthma with (acute) exacerbation: Secondary | ICD-10-CM

## 2022-10-20 MED ORDER — BUDESONIDE-FORMOTEROL FUMARATE 160-4.5 MCG/ACT IN AERO: 2.0000 | INHALATION_SPRAY | Freq: Two times a day (BID) | RESPIRATORY_TRACT | 1 refills | Status: DC

## 2022-10-20 MED ORDER — METHYLPREDNISOLONE 4 MG PO TBPK: ORAL_TABLET | ORAL | 0 refills | Status: DC

## 2022-10-20 MED ORDER — ALBUTEROL SULFATE (2.5 MG/3ML) 0.083% IN NEBU
INHALATION_SOLUTION | RESPIRATORY_TRACT | Status: AC
  Filled 2022-10-20: qty 3

## 2022-10-20 MED ORDER — METHYLPREDNISOLONE ACETATE 80 MG/ML IJ SUSP
60.0000 mg | Freq: Once | INTRAMUSCULAR | Status: AC
  Administered 2022-10-20: 60 mg via INTRAMUSCULAR

## 2022-10-20 MED ORDER — ALBUTEROL SULFATE HFA 108 (90 BASE) MCG/ACT IN AERS: 2.0000 | INHALATION_SPRAY | Freq: Four times a day (QID) | RESPIRATORY_TRACT | 1 refills | Status: DC | PRN

## 2022-10-20 MED ORDER — FLUTICASONE PROPIONATE 50 MCG/ACT NA SUSP: 1.0000 | Freq: Every day | NASAL | 1 refills | Status: DC

## 2022-10-20 MED ORDER — ALBUTEROL SULFATE (2.5 MG/3ML) 0.083% IN NEBU
2.5000 mg | INHALATION_SOLUTION | Freq: Once | RESPIRATORY_TRACT | Status: AC
  Administered 2022-10-20: 2.5 mg via RESPIRATORY_TRACT

## 2022-10-20 MED ORDER — ESCITALOPRAM OXALATE 5 MG PO TABS: 5.0000 mg | ORAL_TABLET | Freq: Every day | ORAL | 1 refills | Status: DC

## 2022-10-20 MED ORDER — METHYLPREDNISOLONE ACETATE 80 MG/ML IJ SUSP
INTRAMUSCULAR | Status: AC
  Filled 2022-10-20: qty 1

## 2022-10-20 MED ORDER — CETIRIZINE HCL 10 MG PO TABS: 10.0000 mg | ORAL_TABLET | Freq: Every day | ORAL | 1 refills | Status: DC

## 2022-10-20 MED ORDER — MONTELUKAST SODIUM 10 MG PO TABS: 10.0000 mg | ORAL_TABLET | Freq: Every day | ORAL | 1 refills | Status: DC

## 2022-10-20 NOTE — Discharge Instructions (Addendum)
Please read below to learn more about the medications, dosages and frequencies that I recommend to help alleviate your symptoms and to get you feeling better soon:   Depo-Medrol IM (methylprednisolone):  To quickly address your significant respiratory inflammation, you were provided with an injection of Solu-Medrol in the office today.  You should continue to feel the full benefit of the steroid for the next 8 to 12 hours.  Medrol Dosepak (methylprednisolone): This is a steroid that will significantly calm your upper and lower airways.  Please take one row of tablets daily with your breakfast meal starting tomorrow morning until the prescription is complete.      Symbicort (budesonide and formoterol): Please inhale 2 puffs twice daily.  This inhaled medication contains a corticosteroid and long-acting form of albuterol.  The inhaled steroid and this medication  is not absorbed into the body and will not cause side effects such as increased blood sugar levels, irritability, sleeplessness or weight gain.  Inhaled corticosteroid are sort of like topical steroid creams but, as you can imagine, it is not practical to attempt to rub a steroid cream inside of your lungs.  The long-acting albuterol works similarly to the short acting albuterol found in your rescue inhaler but provides 24-hour relaxation of the smooth muscles that open and constrict your airways; your short acting rescue inhaler can only provide for a few hours this benefit for a few hours.  Please feel free to continue using your short acting rescue inhaler as often as needed throughout the day for shortness of breath, wheezing, and cough.   ProAir, Ventolin, Proventil (albuterol): This inhaled medication contains a short acting beta agonist bronchodilator.  This medication relaxes the smooth muscle of the airway in the lungs.  When these muscles are tight, breathing becomes more constricted.  The result of relaxation of the smooth muscle is  increased air movement and improved work of breathing.  This is a short acting medication that can be used every 4-6 hours as needed for increased work of breathing, shortness of breath, wheezing and excessive coughing.  It comes in the form of a handheld inhaler or nebulizer solution.  I recommended that for the next 3 to 4 days, this medication is used 4 times daily on a scheduled basis then decrease to twice daily and as needed until symptoms have completely resolved which I anticipate will be several weeks.   Zyrtec (cetirizine): This is an excellent second-generation antihistamine that helps to reduce respiratory inflammatory response to environmental allergens.  In some patients, this medication can cause daytime sleepiness so I recommend that you take 1 tablet daily at bedtime.     Singulair (montelukast): This is a mast cell stabilizer that works well with antihistamines.  Mast cells are responsible for stimulating histamine production.  Reducing the activity of mast cells decreases the amount of histamines will be produced.  This reduces upper and lower respiratory inflammation caused by allergy exposure.  I recommend that you take this medication at the same time you take your antihistamine.   Flonase (fluticasone): This is a steroid nasal spray that used once daily, 1 spray in each nare.  This works best when used on a daily basis. This medication does not work well if it is only used when you think you need it.  After 3 to 5 days of use, you will notice significant reduction of the inflammation and mucus production that is currently being caused by exposure to allergens, whether seasonal or environmental.  The most common side effect of this medication is nosebleeds.  If you experience a nosebleed, please discontinue use for 1 week, then feel free to resume.  If you find that your insurance will not pay for this medication, please consider a different nasal steroids such as Nasonex (mometasone), or  Nasacort (triamcinolone).  I have included some information below about asthma that I hope you find helpful.  I have sent prescriptions for all of your medications to your pharmacy with refills enough to last you 6 months.  It is very important that you follow-up with your primary care provider on a regular basis.  With your health insurance, you can see a regular provider at regular intervals, typically every 3 months, for regular monitoring of your asthma.  If you do not currently have a primary care provider in this area, please use the QR code on the last page of this visit summary to reach [facility] appointment scheduling website.  You can schedule yourself appointment online.   ??????? ???? ? ?????? ???????? ?? ???????? ?? ??? ? ???? ???????? ????? ????? ????? ?? ?? ?? ??????? ??? ?? ????? ? ??? ????? ?? ????? ???? ?? ???? ?? ?? ??? ?? ????? ????:  Depo-Medrol IM (methylprednisolone): ????? ? ??? ?? ????? ?????? ?? ???? ??? ?? ???? ?????? ???? ?? ?? ?? ???? ?? ? ???? ?????? ??????? ????? ??. ???? ???? ? ???????? 8 ??? ?? 12 ??????? ???? ? ?????? ???? ??? ????? ???? ?? ???? ?????. Medrol Dosepak (methylprednisolone): ?? ?? ?????? ?? ?? ????? ?????? ?? ???? ????? ???? ?? ? ??? ?? ???? ???. ??????? ???? ??? ??? ? ???? ? ????? ??? ?? ???? ????????? ????? ? ??? ???? ??? ? ???? ??????? ????.  Symbicort (budesonide and formoterol): ??????? ???? ?? ??? ?? ??? ??? 2 ?? ???? ???. ?? ???? ??? ???? ? ????????????? ?? ? ???????? ????????? ??? ???. ???? ??? ?????? ?? ?? ???? ?? ??? ?? ?? ??? ???? ?? ? ?????? ????? ???? ?? ???? ??? ? ???? ? ??? ??? ???? ??? ? ????? ? ?? ???? ?? ? ??? ????????. ???? ??? ????????????? ? ?????? ?????? ??????? ?? ??? ?? ??? ? ??? ???? ?? ???? ???? ???? ?? ? ?? ???? ??? ?? ??? ???? ????? ? ??? ???? ? ?????? ???? ??? ???. ???? ??? ?????? ???????? ? ??? ??? ???? ???????? ?? ??? ??? ??? ?? ????? ? ?????? ??? ????????? ?? ????? ???? ??? ? ??? ?????? 24 ????? ???? ???? ??? ??  ????? ????? ???? ?????? ?? ???????? ????? ? ??? ??? ?????? ?????? ???? ?? ????? ? ?? ??????? ????? ?? ??? ? ?? ??????? ????? ???? ???. ??????? ???? ???? ????? ???? ? ??? ???? ? ???? ?? ???? ????? ? ???? ?? ????? ?? ? ????? ?? ???? ?? ? ??? ??? ?????? ?????? ?????? ?? ???? ?????.  ProAir? Ventolin? Proventil (albuterol): ?? ????? ???? ? ??? ??? ???? ??????? ??????????? ???. ?? ???? ?? ??? ?? ? ??? ? ???? ??? ????? ??????. ??? ?? ?? ????? ??? ??? ???? ??? ?? ????????. ? ??? ?????? ? ???? ???? ????? ? ??? ???? ?? ? ???? ?? ???? ??. ?? ?? ??? ??? ?????? ???? ?? ?? ?? ??? 4-6 ??????? ?? ? ????? ??? ? ????????? ? ??? ????? ???? ?? ??? ???? ????? ????? ???? ??. ?? ? ???? ??? ????????? ?? ???????? ?? ?? ??? ?? ????. ?? ???????? ???? ?? ? ???????? 3 ??? ?? 4 ???? ????? ?? ???? ?? ??? ??  4 ??? ?? ???? ??? ??? ?? ????? ???? ??? ?? ??? ?? ??? ??? ?? ?? ?? ? ????? ?? ???? ?? ?? ??? ???? ?? ??? ????? ?? ????? ???? ?? ??? ?? ?? ?? ?? ???? ??? ?? ???? ??.  Zyrtec (cetirizine): ?? ?? ???? ???? ??? ???? ??????? ?? ?? ? ???????? ????????? ?? ?????? ? ????? ??????? ?????? ????? ?? ????? ???. ?? ???? ???????? ??? ?? ???? ???? ?? ? ???? ? ??? ???? ??? ?? ?? ??????? ??? ?? ???? ??? ??? ? ??? ?? ??? ?? 1 ?????? ?????.  Singulair (montelukast): ?? ? ???? ??? ????????? ?? ?? ? ???? ??????? ??? ?? ??? ???. ???? ???? ? ??????? ????? ????? ?????? ???. ? ???? ???? ?????? ???? ? ??????? ????? ???? ?? ????? ????. ?? ? ????? ? ???????? ?? ???? ? ?????? ?? ???? ????? ?????? ????. ?? ??????? ??? ?? ???? ?? ???? ?? ???? ??? ?? ????? ?? ???? ??? ???? ??????? ????.  Flonase (fluticasone): ?? ?? ?????? ??? ???? ?? ?? ?? ??? ?? ?? ?? ????? ????? ?? ?? ??? ?? 1 ????. ?? ???? ??? ??? ??? ?? ??? ??? ????? ????. ?? ???? ?? ??? ?? ??? ?? ???? ?? ????? ??? ??? ?????? ?? ??? ?? ???? ??? ??? ???? ???? ????? ???. ? 3 ??? ?? 5 ???? ???? ? ?????? ??????? ???? ?? ? ?????? ?? ???? ????? ?? ? ??? ?? ???? ????? ?? ?? ???? ? ?????? ??? ?? ????? ?? ????? ??  ???????? ??. ? ?? ????? ?????? ??? ?????? ????? ? ???? ???? ??. ?? ???? ? ???? ???? ????? ???? ??????? ???? ? 1 ???? ????? ????? ??? ???? ??? ? ??? ??? ???? ????? ???? ????? ????. ?? ???? ????? ?? ????? ???? ?? ? ?? ????? ????? ???? ??????? ??????? ???? ? ??? ????? ????????? ?? ??? ?? ????? ??? Nasonex (mometasone)? ?? Nasacort (triamcinolone).  ?? ????? ? ???? ?? ??? ???? ??????? ???? ??? ?? ?? ?? ???? ??? ?? ???? ???? ?????. ?? ????? ??????? ?? ????? ? ???? ????? ???? ????? ?? ?? ???? ?? ? 6 ?????? ???? ????? ???? ????. ?? ???? ???? ?? ?? ???? ?? ???? ??? ? ??? ?????? ??????? ???? ????? ??? ????? ???. ????? ? ?????? ???? ???? ???? ???? ?? ?? ???? ?????? ??? ?? ?????? ???? ?? ??? 3 ?????? ??? ????? ? ????? ????? ????? ????? ?? ???? ???? ????? ?????. ?? ???? ??? ???? ??? ???? ?? ?????? ??????? ???? ????? ????? ??????? ???? ? ?? ????? ????? ?? ?????? ?? ?? ? QR ??? ?????? ???? ? ????? ?????? ????? ??????? ????? ??????? ??? ???? ?? ??????. ???? ???? ?? ??? ??? ?????? ??????? ???.  ???? ?? ????????? (?????) ???? ?? ?? ? ?????? ???? ???? ???? ?? ??? ?? ?? ?? ??? ?? ??? ????? ???? ??? ?? ??? ????. ??????? ? ??? ????? ???? ?????? ? ??? ?????? ? ???? ?? ?????? ?? ???? ???????? ????. ? ???? ?????? ?? ? ???? ? ???? ?? ? ???? ????? ?? ??? ?? ??????? ????? ?? ? ???? ??? ??? ? ???? ???? ?? ??? ?? ? ??? ?? ????? ???????? ???? ???? ?????? ??? ?? ???? ???? ??? (??????)? ? ??? ????? ?? ? ???? ???. ????? ???? ???? ????? ???? ????? ??? ?? ? ???? ?? ???? ?? ???? ???????? ????. ? ???? ?? ????? ??? ???? ??? ??????? ???? ??. ? ????? ???? ????? ?? ?? ????? ??? ? ???? ???????? ??. ???? ? ?????? ?? ?? ??? ??? ???? ?? ? ???? ??? ????? ???? ?? ? ?? ?? ?????? ?? ????? ???? ?? ? ???? ??????? ?????? ????. ?? ???? ?? ?? ????? ? ??? ???? ?? ?? ???? ?????? ??? ???? ?? ???? ????? ?? ????? ???? ?? ?????? ??? ???.  ??????? ?? ?? ??? ???? ?????? ???? ?? ?? ???? ? ?????? (??????) ?? ????????? ?????? ?? ???? ???????? ???? ??? ???? ??? ?? ?????  ?? ??. ?? ?? ???? ?? ? ?????? ???? ???????? ???? ???? ???? ???? ?? ? ?????? ???? ???????? ??? ?? ??? ????? ???. ?? ?????? ? ?? ??? ????? ????? ???. ??? ??????? ????? ?? ??:  ?????? ?? ???????? ???? ??? ??????? ????? ? ????? ????? ??????? ????? ? ??? ??????? ?? ??????? ???.  ? ?????? ????.  ? ??? ????? ?? ??? ???.  ???? ?? ??? ???????? ????????? ??? ??? ?? ??? ????.  ???? ???? ??? ?????? ?? ???? ??????.  ??????? ?? ??????? ????? ??? ????? ????? ???? ???? ?? ? ??? ? ??? ?????? (???????).  ??? ?? ????? ?? ??? ??? ????? ????? ?? ? ???? ? ???? ??? ? ????? ???? ?????? ?? ?? ????? ?????? ???? ?? ?? ????? ?? ? ??? ???? ????? ????. ??? ??? ?? ??? ????? ????? ?? ??:  ????.  ????? ?????? (? ??? ????).  ? ??? ??? ??? ?? ???? ???? ????.  ? ???? ???????.  ????? (?????) ? ?? ?????? ???.  ?? ????? ???? ?? ? ???? ???? ????.  ? ????? ?????? ???.  ?? ???? ?????? ????? ?????? ????? ??? ??? ????? ? ??? ?????? ??? ?????? ???? ??. ?????? ?????? ???? ??:  ????? ? ????? ??????? ??????? ?? ??????.  ???? ??? ????. ??? ???? ?????? ? ???? ? ?????? ????? ????? ????? ? ??? ???? ??? ???:  o ??????? ????. ?? ? ???? ? ??? ????? ?????? ?? ????? ???. ??? ??? ??? ?????? ????.   o ????? ??? ????? ?? ? ?????? ????. ?? ?? ???? ??? ? ?????? ??? ??? ???. ??? ? ????? ??? ?? ????? ???? ?? ???????? ???? ???? ???.  ? ???? ????? ????? ???:   o ? ????? ????? ??? ???? ???????? ?? ????? ? ?????? ???? ? ?????? ???? ???????? ????.   o ??????? ???? (???????????????). ?? ??? ???? ?? ?? ? ?????? ????? ?????? ?? ????? ???.  ? ???? ??? ???? ?????. ? ???? ??? ???? ????? ? ?????? ???? ????? ???? ?? ?????? ????? ???? ??? ???? ??. ??? ???? ?? ???? ??:   o ????? ? ???? ? ??????? ???? ?? ? ???? ??? ? ?????? ????????.   o ? ?? ?? ??? ??????? ?? ??? ???? ???? ??????? ?? ?? ??? ? ??? ??? ???? ??? ??.   o ? ????? ?????? ?? ??? ???????? ?? ? ??? ????? ???? ?? ??? ??????. ? ??? ????? ???? ?????? ??? ?? ??? ????? ?? ??? ??? ?? ????? ? ?????? ???. ??  ????? ??? ????? ? ???? ????? ?? ????? ???.  ?? ??? ?? ?? ???????? ????? ???:  ? ???? ?? ???? ???? ????? ??? ??? ????? ??? ???? ?? ????? ? ?????? ??????? ???? ????? ???? ??? ???.  ? ???? ????????? ?? ??? ???? ???? ??? ???? ?? ????? ? ?????? ??????? ???? ????? ???? ??????? ???? ????? ? ??? ?? ???? ??? ?????????.  ? ??? ????? ???? ?????? ?? ? ??? ??? ????? ?????? ????? ?????.  ? ????? ? ?????? ? ?????? ? ?????? ????? ? ??? ??? ???? ??? ?? ??????.  ???? ?? ???? ?? ?? ?? ?? ????? ????? ?? ????? ?? ??? ?? ???? ????. ? ?????? ??????? ???? ????? ??? ????? ????? ??:  ???? ????? ??? ????? ?? ???? ??? ?? ????? ?? ???? ?? ?????.  ????? ???? ? ????? ?????? ???? ????? ??? ????? ????? ?????? ?? ????? ??????.  ???? ????? ??? ?? ?? ???? ?? ?? 2-3 ??? ??? ??? ???? ???? ??????.  ? 1 ???? ????? ????? ? ??? ???? ???????? ?????? ????? ? ??? ????? ????? ???? ????? ? ???? ???? 50-79? ???? ??.  ???? ??? ?? ? ??? ???? ???. ?????? ????? ?????? ??? ??:  ? ????? ? ???? ?? ??? ?? ???? ???? ???? ?? ?????? ?? ???? ?? ?????.  ??? ?? ?? ??????? ?? ???? ?? ??? ?? ??? ?? ????? ?????? ??? ???? ???? ???.  ???? ?? ?????? ???? ?? ???? ???? ?? ???? ???.  ???? ? ???? ??? ?? ???? ???.  ???? ? ??? ????? ???? ?? ?????? ???? ???.  ????? ? ????? ?? ?????? ??? ??? ??? ???.  ???? ??? ???? ?? ??? ????? ?? ???? ?? ???? ????.  ????? ? ??? ????? ????? ????? ? ???? ???? 50? ??? ?? ??.  ???? ?? ?????? ??? ???? ???? ????? ??? ???? ????? ???. ?? ??? ????? ????? ?? ????? ??. ?????? ????? ?????? ???. 911 ?? ??? ????.  ?????? ?? ??? ???? ????? ?? ??? ????? ?? ???? ??.  ??? ?????? ?? ?? ???.  ?????  ???? ?? ????????? (????) ???? ?? ?? ? ?????? ???? ???? ???? ?? ??? ?? ?? ? ??? ???? ??? ?? ???? ????. ? ???? ??????? ?? ? ?????? ???? ?? ? ???? ????? ?? ??? ????? ? ????? ????? ??? ????? ?? ? ???? ??? ???? ???? ??.  ? ????? ?????? ?????? ?? ????? ??? ???? ?? ? ???? ??? ????? ???? ?? ? ?? ?? ?? ?????? ?? ????? ???? ?? ? ??? ???? ??? ?????.  ??? ??????  ??? ?? ???? ?????? ?? ???? ? ??????? ??? ?????? ???? ?? ???? ?? ??? ??? ???? ??????.  ? ????? ???? ????? ?? ?? ????? ??? ? ???? ???????? ??. ??????? ????? ?????? ??? ?? ???? ? ?????? ???? ???? ?? ????? ? ???? ?????? ????? ??? ?????? ?? ???? ??? ????.  meherbani wakri da darmalo, khurakuno ao frikooncy paa ara da nooro malumato lapara landy waloli chi zaa yay worandiz kom chi staso da nakho kamolo ke marsta wakri ao taso zar tar zra kha ihsas wakri: Depo-Medrol IM (metheelprednisolone): staso da pam war tanfse altahhab paa chataki sara hal kolo lapara, taso taa nan paa daftar ke da solo medrol engiction darkral sho.  taso bayed da ratlunko 8 sakha tar 12 saatuno pory da strid bashpar gaty ihsas kolo taa dawam warkri.  Medrol Dosepak (metheelprednisolone): da yo strid di chi staso portani ao khkata valvae lary baa da pam war aram kri.  meherbani Kandice Hams da sehehar da khwaro sara yo qutar Synthia Innocent da saba sehehar sakha da naskhi bashparido pory.    Symbicort (budesonide and formoterol): meherbani wakri paa warz ke dawa zala 2 pef tanfs kri.  da tanfs shawi darmal da korticostrid ao da ulbotrol ugdamahhala banra lari.  tanfs shawi strid ao da darmal paa badan ke naa juzb kegi ao da arkhizo aghizo lamal naa kegi laka da weny da shkar Jodi Marble lurha kol , Ricky Stabs , bi khobai ya da wazan zyatwali.  tanfs shawi korticostrid da mozooe strid [employer] paa ser Advertising account executive , laka sanga chi taso taswer Kandace Blitz , da amle nada chi valasa wakri staso da sago danana da strid krem mash kri.  ugd omal koonki ulbotrol da land omal kolo ulbotrol paa ser kar kawi chi staso da zhghorni sa akistunki ke moondal kegi magar da narm ozlato 24 saata aram chamto kawi chi staso valvae lary khlasavi ao mahdudvi; staso da land omal reskio anahhilar koli shi yawazy da so saatuno Landscape architect so saatuno lapara [employer].  meherbani wakri warhya ihsas wakri da sa landi , tokhi ao tokhi lapara da warze paa ugdo ke da artya  paa surat ke da land omal reskio anahhilar karolo taa dawam warkri. ProAir, Ventolin, Proventil (albuterol): da tanfse darmal da land omal beta agunist brunkadeliter lari.  da darmal paa sago ke da valva da lary narm ozlat aramvi.  kala chi da ozlat tang wee, tanfs noor valm mahdudegi.  da narm ozlato da aram kolo paila da valva harkat ao da tanfs kha wali di.  da yo land omal koonki darmal di chi paa valro 4-6 saatuno ke da tanfse kar da zyatwali, da sa [place], tokhi ao der tokhi lapara karol kedi shi.  da da lasi sa akistunki ya neebolizar hal paa shkal ke raze.  ma sparkhtana wakra chi da ratlunko 3 sakha tar 4 wrho pory, da darmal paa Margaretmary Bayley ke 4 zala paa takal shawi wakht ke karol kegi bia paa warz ke dawa zala kam shi ao da artya paa surat ke tar valagha pory chi nakhe nakhani paa bashpra toga hal shawi wee chi zaa yay atcal kom so awani wee. Zyrtec (cetirizine): da yo ghura dawahmm nasal intee valastamin di chi da chaperyal alarjinuno paa worandy da tanfse altahhabi ghabargun kamolo ke [place].  paa zeeno naroghano ke, da darmal koli shi da warze da khob lamal shi, no zaa worandiz kom chi taso valara warz da khob paa wakht ke 1 tableet wakhuri.   Singulair (montelukast): Musician mast sel stibalaezar di chi da intee valastamin sara kha [place].  musty hujri da valastamin tolid valasolo musuliat lari.  da mast hujro faaliat kamol da valastamin maqdar kamwe chi tolid [place].  Air traffic controller sargandido laa amla da portani ao khkata tanfse altahhab kamwe.  zaa worandiz kom chi taso da  darmal paa warta wakht ke wakhuri chi taso khpal intee valastamin akhle. Flonase (fluticasone): da yo strid nak spari di chi paa Margaretmary Bayley ke yo zal karol kegi, paa valar nari ke 1 spari.  da ghura kar kawi kala chi valara [place] Lao. da darmal kha kar naa Rexanne Mano chery da yawazy valagha wakht wakarol shi kala chi taso fakar koe taso warta Manning Charity.  da 3 sakha tar 5 wrho pory da [employer], taso baa da altahhab ao [place]  tolid ke da pam war kamakht ogorai chi da mehehal da alarjin sara makh kegi, kaa mosmi ya chaperyal wee.  da day darmalo tartolo am arkhiza [employer] da pozi wena daa.  Sherryle Lis taso da pozi wena tajarba kri, meherbani wakri da 1 awani lapara karol band [place], bia da bia pel kolo lapara warhya ihsas [place] taso wamumi chi staso bema baa da day darmalo lapara pesy warnakri, Reece Levy da nak makhtalf stridona paa pam ke wanisai laka Nasonex (mometasone), ya Nasacort (triamcinolone). ma landy da isma paa ara zeeny malumat shamal kri dee chi zaa amid larm chi taso gatur wamumi.  ma staso darmaltun taa staso da tolo darmalo naskhi legali dee chi taso taa da 6 miashto dawam lapara kafi dakol.  da [place] Slider maahmma daa chi taso paa manzam dol da khpal lumrani pamlrni chamto konki sara taqib kri.  staso da roghtia bemy sara, taso koli shai paa manzam wafono ke, paa zangri toga paa valro 3 miashto ke, staso da salmy manzmi sarni lapara yo manzam chamto konki ogorai.  kaa taso os mehehal pady sima ke lumrani pamlrni chamto konki nalarai, meherbani wakri da day ledni landiz paa wrusti makh ke da QR kod wakarwai tarso da makhrut roghtia berani pamlrni ledni mehehalwesh web panry taa warsegy.  taso koli shai khpal zan aanlin mehehalwesh kri. isma yo ugdamahhala (mazmana) halat di chi da takrari pekho lamal kegi paa kom ke chi paa sago ke tet valvae lary tang ao tang kegi. tangwali da tet valvae lary shaokhwa da narm ozlato da sozash ao sakhtedo laa amla raminzta kegi. da isma Leota Sauers chi da isma da hamli ya da isma faleerz paa num valm yadegi, kedai shi da tokhi subbb shi, da tanfs kolo paa wakht ke da lurh ghag ghagona raminzta [place], deri wakhtuna kala chi taso tanfs koe (ghoridal), da sa landi, ao da siny dard. valvae lary mumkin izafi [place]ham tolid kri chi da sozash ao kharsh laa amla raminzta kegi. da bred paa jaryan ke, tanfs kol stunzman kedi shi. da salmy hamli kedai shi laa kochani sakha da jwand gwasunki wee. isma da  darmalany war naa daa, magar darmal ao da jwand tarz badloon koli shi da day paa kantrol ke marsta wakri ao da shadid bredono darmalana wakri. da maahmma daa chi staso da sa landi paa kha toga kantrol kri tarso da halat staso paa warzani jwand ke madakhla wana kri. lamluna yay saa dee? dasee engiral kegi chi da halat da meraci (janiatik) ao chaperyali awamlo laa amla raminzta shawi, magar daqiq alat yay malum naa di. saa shi koli shi da salandi bred raminzta kri? deri shian koli shi da salandi bred [place] kri ya nakhe [place]. da [employer] da valar shahs lapara toper lari. am muharkuna ibarat dee laa:  alarjin ao kharkhonki mawad laka chanraski, dawary, da palto [place], [place], garda, da valva [place], ao [place] bawi.  da [employer] [place] [place].  da valva badloon ao sarha valva.  fashar ao qawi ihsasati ghabargunona laka jaral ya sakht [place].  zini darmal laka asparin ya beta balakers.  intanat ao altahhabi shraet laka zakam, zakam, sena baghal, ya da pazi da ghasha altahhab (rayenats). [employer] ao elaim saa dee? nakhe nakhani kedai shi da isma da mahrak sara da makhamakh kedo wrusta ya so saata wrusta woq shi ao kedai shi da shahs lukhwa toper walari. am nakhe ao nakhe nakhani ibarat dee laa:  tokhi.  tanfse stunza (da sa landi).  da shpee der wakht ya sehehar wakhti tokhi.  da siny sakhtwali.  starhya (starhya) da lag faaliat sara.  paa bashparo jamlo ke da khbro kolo mashkal.  da tamrin kamzuri zgham. da sanga darmalana kegi? darmalana nashta, magar Bronson Curb da sami darmalany sara kantrol kedi shi. darmalana mamula shamal dee:  staso da salmy muharkato pejandana ao mahnewi.  tanfs shawi darmal. dawa dola Hermelinda Dellen da isma da darmalany lapara Deberah Castle, da shadat pory ara lari:  o Arvin Collard da da isma da nakho nakhano mahnewi ke Teressa Lower. dwi valara warz akistal kegi.  o garandi omal konki Social worker. da paa chataki sara da salandi nakhe lary Bee. dwi da artya sara  sam karol kegi ao landemahhala rahat chamto kawi.  da nooro darmalo karol laka:  o da alarji darmal, laka intee valastamin, kaa staso da salandi hamli da alarjin lukhwa raminzta kegi.  o muafiati darmal (amunomodoltrona). da valagha darmal dee chi da muafiat sistam kantrol ke Teressa Lower.  da salim omal plan jorol. da salim omal plan staso da salandi hamli edara kolo ao darmalany lapara lekal shawi plan di. pady plan ke shamal dee:  o staso da salim da muharkato lest ao da valaghwe sakha da mahnewi sarangwali.  o da day paa ara malumat chi kala darmal bayed wakistal shi ao kala da dwi doz bayed badal shi.  o da wasely karolo paa ara lerkhaoni chi da lurh jaryan meter paa num yadegi. da lurh jaryan meter [place] kawi chi sugi somra kha kar kawi ao staso da salandi shadat. da staso sara staso da halat sarna ke Teressa Lower. paa kor ke da lerkhaoni taqib kri:  da naskhi ao naskhi darmal yawazy valagha dol wakhle laka sanga chi staso da roghtia pamlrni chamto konki lukhwa wel shawi.  da tolo vaccinuno paa ara taza osai laka sanga chi staso da roghtia pamlrni chamto konki lukhwa worandiz shawi, pashmol da flo ao sena baghal vaccinuna.  da lurh Research scientist (physical sciences) da khpal lurh jaryan lustalo taqib [place].  da salmy da naroghy da khpredo da mahnewi lapara da khpal omal plan dark ao wakarwai.  eskrt maa sakwe ao naa cha taa ejaza warkri chi staso paa kor ke eskrt sakawi. da roghtia pamlrni chamto konki sara areka wanisai kaa:  taso tokhai, sa landi, ya tokhi larai chi darmalo taa zwab naa warkawi.  Georgann Housekeeper darmal da janbi awarzu lamal garzi, Candace Gallus, parasob, ya tanfse stunza.  taso artya larai chi paa awani ke laa 2-3 zala sakha der aram darmal wakarwai.  da 1 saat lapara staso da [place] plan taqibolo wrusta staso da lurh jaryan lustal lahhem staso da shahci ghura 50-79% pory dee.  taso tba ao da sa landi larai. samdesti marsta tarlasa kri kaa:  da salmy da hamli paa wakht ke taso badatar  kerae ao darmalany taa zwab naa warkoe.  kala chi paa istrahat ke yast ya kala chi der lag pysiki faaliat koe taso tanfs koe.  taso paa khoralo, sakhlo ya khbro kolo ke mashkal larai.  taso da siny dard ya sakhti  Renee Rival da zra garandi takan ya darzuna paida koe.  staso da shundo ao nukano rang shin rang lari.  taso spak yast ya chkar yast, ya taso bi valukha yast.  staso da lurh jaryan lustal staso da shahci ghura 50% sakha kam dee.  taso paa noormal dol tanfs kolo lapara der stari ihsas koe. da nakhe nakhani kedai shi berani wee. samdesti marsta tarlasa kri. 911 taa zang wowauhi.  intizar maa koe tarso ogorai chi nakhe nakhani laa [employer].  zan roghtun taa maa Zenon Mayo  isma yo ugdamahhala (mazman) halat di chi da takrari pekho lamal kegi paa kom ke chi da valva lary tang ao tangi kegi. da Hazle Quant, chi da salandi hamli ya da isma faleerz valm wel [place], da tokhi, tokhai, sa landi, ao da siny dard lamal kedi shi.  da salmy naroghy darmalana naa kegi, magar darmal ao da jwand tarz badloon koli shi da day paa kha kantrol ke marsta wakri ao da sa landi makha wanisi.  dad tarlasa [employer] chi taso poheegy chi sanga da muharkato sakha mahnewi wakri ao sanga ao kala khpal darmal wakarwai.  da salmy hamli kedai shi laa kochani sakha da jwand gwasunki wee. samdalasa marsta tarlasa kri kaa taso da salandi hamla walarai ao staso da adi zhghorni darmalo sara darmalany taa zwab wana waye.

## 2022-10-20 NOTE — ED Triage Notes (Signed)
Patient here today with c/o cough, wheeze, and SOB X 1 week. He was also here a couple weeks ago and had his ears cleaned but he would like to have them checked again because he says it feels a little heavy.

## 2022-10-20 NOTE — ED Provider Notes (Signed)
[facility]    CSN: [id number] Arrival date & time: 10/20/22  1555    HISTORY   Chief Complaint  Patient presents with   Cough   HPI Eric Mcbride is a pleasant, 32 y.o. male who presents to urgent care today. Patient complains of nonproductive cough, wheezing and shortness of breath for the past week.  EMR reviewed, patient has a history of moderate persistent asthma and has had multiple ED visits for this.  Patient states that as long as he stays on his asthma and allergy medication he is fine but when he runs out of them his symptoms come back.  Patient states he is currently established with a primary care provider in this area, next appointment is October 28, 2022.  Patient denies fever, body aches, chills, otalgia, cough productive of sputum.  The history is provided by the patient.   Past Medical History:  Diagnosis Date   Acid reflux disease    Constipation    Dyspepsia    H. 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   Leg cramping 03/10/2022   Asthma 03/10/2022   Encounter for sperm count for fertility testing 03/10/2022   GAD (generalized anxiety disorder) 10/28/2021   MDD (major depressive disorder) 10/28/2021   History of typhoid fever 05/29/2021   Lower abdominal pain 01/21/2021   Loss of weight 01/21/2021   Gastroesophageal reflux disease without esophagitis 10/01/2020   Black stool 10/01/2020   History of Helicobacter pylori infection 10/01/2020   Past Surgical History:  Procedure Laterality Date   NO PAST SURGERIES      Home Medications    Prior to Admission medications   Medication Sig Start Date End Date Taking? Authorizing Provider  albuterol (VENTOLIN HFA) 108 (90 Base) MCG/ACT inhaler Inhale 2 puffs into the lungs every 6 (six) hours as needed for wheezing or shortness of breath (Cough). 10/20/22 01/18/23 Yes Theadora Rama Scales, PA-C  budesonide-formoterol  (SYMBICORT) 160-4.5 MCG/ACT inhaler Inhale 2 puffs into the lungs every 12 (twelve) hours. 10/20/22 04/18/23 Yes Theadora Rama Scales, PA-C  cetirizine (ZYRTEC ALLERGY) 10 MG tablet Take 1 tablet (10 mg total) by mouth at bedtime. 10/20/22 04/18/23 Yes Theadora Rama Scales, PA-C  escitalopram (LEXAPRO) 5 MG tablet Take 1 tablet (5 mg total) by mouth daily. 10/20/22 04/18/23 Yes Theadora Rama Scales, PA-C  fluticasone (FLONASE) 50 MCG/ACT nasal spray Place 1 spray into both nostrils daily. 10/20/22  Yes Theadora Rama Scales, PA-C  methylPREDNISolone (MEDROL DOSEPAK) 4 MG TBPK tablet Take 24 mg on day 1, 20 mg on day 2, 16 mg on day 3, 12 mg on day 4, 8 mg on day 5, 4 mg on day 6.  Take all tablets in each row at once, do not spread tablets out throughout the day. 10/20/22  Yes Theadora Rama Scales, PA-C  montelukast (SINGULAIR) 10 MG tablet Take 1 tablet (10 mg total) by mouth at bedtime. 10/20/22 04/18/23 Yes Theadora Rama Scales, PA-C    Family History Family History  Family history unknown: Yes   Social History Social History   Tobacco Use   Smoking status: Never   Smokeless tobacco: Never  Vaping Use   Vaping status: Never Used  Substance Use Topics   Alcohol use: Not Currently   Drug use: Not Currently   Allergies   Augmentin [amoxicillin-pot clavulanate]  Review of Systems Review of Systems Pertinent findings revealed after performing a 14 point review of  systems has been noted in the history of present illness.  Physical Exam Vital Signs BP (!) 126/91 (BP Location: Left Arm)   Pulse 87   Temp 98.8 F (37.1 C) (Oral)   Resp 16   Ht 5' 8.9" (1.75 m)   Wt 135 lb (61.2 kg)   SpO2 91%   BMI 20.00 kg/m   No data found.  Physical Exam Vitals and nursing note reviewed.  Constitutional:      General: He is not in acute distress.    Appearance: Normal appearance. He is not ill-appearing.  HENT:     Head: Normocephalic and atraumatic.     Salivary Glands: Right  salivary gland is not diffusely enlarged or tender. Left salivary gland is not diffusely enlarged or tender.     Right Ear: Ear canal and external ear normal. No drainage. A middle ear effusion is present. There is no impacted cerumen. Tympanic membrane is bulging. Tympanic membrane is not injected or erythematous.     Left Ear: Ear canal and external ear normal. No drainage. A middle ear effusion is present. There is no impacted cerumen. Tympanic membrane is bulging. Tympanic membrane is not injected or erythematous.     Ears:     Comments: Bilateral EACs normal, both TMs bulging with clear fluid    Nose: Rhinorrhea present. No nasal deformity, septal deviation, signs of injury, nasal tenderness, mucosal edema or congestion. Rhinorrhea is clear.     Right Nostril: Occlusion present. No foreign body, epistaxis or septal hematoma.     Left Nostril: Occlusion present. No foreign body, epistaxis or septal hematoma.     Right Turbinates: Enlarged, swollen and pale.     Left Turbinates: Enlarged, swollen and pale.     Right Sinus: No maxillary sinus tenderness or frontal sinus tenderness.     Left Sinus: No maxillary sinus tenderness or frontal sinus tenderness.     Mouth/Throat:     Lips: Pink. No lesions.     Mouth: Mucous membranes are moist. No oral lesions.     Pharynx: Oropharynx is clear. Uvula midline. No posterior oropharyngeal erythema or uvula swelling.     Tonsils: No tonsillar exudate. 0 on the right. 0 on the left.     Comments: Postnasal drip Eyes:     General: Lids are normal.        Right eye: No discharge.        Left eye: No discharge.     Extraocular Movements: Extraocular movements intact.     Conjunctiva/sclera: Conjunctivae normal.     Right eye: Right conjunctiva is not injected.     Left eye: Left conjunctiva is not injected.  Neck:     Trachea: Trachea and phonation normal.  Cardiovascular:     Rate and Rhythm: Normal rate and regular rhythm.     Pulses: Normal  pulses.     Heart sounds: Normal heart sounds. No murmur heard.    No friction rub. No gallop.  Pulmonary:     Effort: Pulmonary effort is normal. No accessory muscle usage, prolonged expiration or respiratory distress.     Breath sounds: No stridor, decreased air movement or transmitted upper airway sounds. Examination of the right-upper field reveals wheezing. Examination of the left-upper field reveals wheezing. Examination of the right-middle field reveals wheezing. Examination of the left-middle field reveals wheezing. Examination of the right-lower field reveals wheezing. Examination of the left-lower field reveals wheezing. Wheezing present. No decreased breath sounds, rhonchi or rales.  Comments: Repeat auscultation post belies bronchodilator revealed improvement of wheezing and work of breathing bilaterally Chest:     Chest wall: No tenderness.  Musculoskeletal:        General: Normal range of motion.     Cervical back: Normal range of motion and neck supple. Normal range of motion.  Lymphadenopathy:     Cervical: No cervical adenopathy.  Skin:    General: Skin is warm and dry.     Findings: No erythema or rash.  Neurological:     General: No focal deficit present.     Mental Status: He is alert and oriented to person, place, and time.  Psychiatric:        Mood and Affect: Mood normal.        Behavior: Behavior normal.     Visual Acuity Right Eye Distance:   Left Eye Distance:   Bilateral Distance:    Right Eye Near:   Left Eye Near:    Bilateral Near:     UC Couse / Diagnostics / Procedures:     Radiology No results found.  Procedures Procedures (including critical care time) EKG  Pending results:  Labs Reviewed - No data to display  Medications Ordered in UC: Medications  methylPREDNISolone acetate (DEPO-MEDROL) injection 60 mg (60 mg Intramuscular Given 10/20/22 1718)  albuterol (PROVENTIL) (2.5 MG/3ML) 0.083% nebulizer solution 2.5 mg (2.5 mg  Nebulization Given 10/20/22 1719)    UC Diagnoses / Final Clinical Impressions(s)   I have reviewed the triage vital signs and the nursing notes.  Pertinent labs & imaging results that were available during my care of the patient were reviewed by me and considered in my medical decision making (see chart for details).    Final diagnoses:  Moderate persistent asthma with acute exacerbation  Mixed anxiety and depressive disorder   During visit today patient received an injection of Depo-Medrol along with nebulized albuterol treatment which resulted in meaningful improvement of his breath sounds and work of breathing.  Patient was provided with refills of all of his asthma and allergy medications and advised to also complete a Medrol Dosepak.  At the end of visit, patient states that he ran out of escitalopram 5 mg which he stated was very helpful for his anxiety.  Patient requested refill of this medication which I provided for him.  Patient was strongly encouraged to have regular follow-up with his primary care provider and to not allow his medications to run out.  Please see discharge instructions below for details of plan of care as provided to patient. ED Prescriptions     Medication Sig Dispense Auth. Provider   cetirizine (ZYRTEC ALLERGY) 10 MG tablet Take 1 tablet (10 mg total) by mouth at bedtime. 90 tablet Theadora Rama Scales, PA-C   fluticasone (FLONASE) 50 MCG/ACT nasal spray Place 1 spray into both nostrils daily. 47.4 mL Theadora Rama Scales, PA-C   montelukast (SINGULAIR) 10 MG tablet Take 1 tablet (10 mg total) by mouth at bedtime. 90 tablet Theadora Rama Scales, PA-C   budesonide-formoterol [facility]) 160-4.5 MCG/ACT inhaler Inhale 2 puffs into the lungs every 12 (twelve) hours. 3 each Theadora Rama Scales, PA-C   methylPREDNISolone (MEDROL DOSEPAK) 4 MG TBPK tablet Take 24 mg on day 1, 20 mg on day 2, 16 mg on day 3, 12 mg on day 4, 8 mg on day 5, 4 mg on day 6.  Take  all tablets in each row at once, do not spread tablets out throughout the day.  21 tablet Theadora Rama Scales, PA-C   albuterol (VENTOLIN HFA) 108 (90 Base) MCG/ACT inhaler Inhale 2 puffs into the lungs every 6 (six) hours as needed for wheezing or shortness of breath (Cough). 54 g Theadora Rama Scales, PA-C   escitalopram (LEXAPRO) 5 MG tablet Take 1 tablet (5 mg total) by mouth daily. 90 tablet Theadora Rama Scales, [place]      PDMP not reviewed this encounter.  Pending results:  Labs Reviewed - No data to display  Discharge Instructions:   Discharge Instructions      Please read below to learn more about the medications, dosages and frequencies that I recommend to help alleviate your symptoms and to get you feeling better soon:   Depo-Medrol IM (methylprednisolone):  To quickly address your significant respiratory inflammation, you were provided with an injection of Solu-Medrol in the office today.  You should continue to feel the full benefit of the steroid for the next 8 to 12 hours.  Medrol Dosepak (methylprednisolone): This is a steroid that will significantly calm your upper and lower airways.  Please take one row of tablets daily with your breakfast meal starting tomorrow morning until the prescription is complete.      Symbicort (budesonide and formoterol): Please inhale 2 puffs twice daily.  This inhaled medication contains a corticosteroid and long-acting form of albuterol.  The inhaled steroid and this medication  is not absorbed into the body and will not cause side effects such as increased blood sugar levels, irritability, sleeplessness or weight gain.  Inhaled corticosteroid are sort of like topical steroid creams but, as you can imagine, it is not practical to attempt to rub a steroid cream inside of your lungs.  The long-acting albuterol works similarly to the short acting albuterol found in your rescue inhaler but provides 24-hour relaxation of the smooth muscles that  open and constrict your airways; your short acting rescue inhaler can only provide for a few hours this benefit for a few hours.  Please feel free to continue using your short acting rescue inhaler as often as needed throughout the day for shortness of breath, wheezing, and cough.   ProAir, Ventolin, Proventil (albuterol): This inhaled medication contains a short acting beta agonist bronchodilator.  This medication relaxes the smooth muscle of the airway in the lungs.  When these muscles are tight, breathing becomes more constricted.  The result of relaxation of the smooth muscle is increased air movement and improved work of breathing.  This is a short acting medication that can be used every 4-6 hours as needed for increased work of breathing, shortness of breath, wheezing and excessive coughing.  It comes in the form of a handheld inhaler or nebulizer solution.  I recommended that for the next 3 to 4 days, this medication is used 4 times daily on a scheduled basis then decrease to twice daily and as needed until symptoms have completely resolved which I anticipate will be several weeks.   Zyrtec (cetirizine): This is an excellent second-generation antihistamine that helps to reduce respiratory inflammatory response to environmental allergens.  In some patients, this medication can cause daytime sleepiness so I recommend that you take 1 tablet daily at bedtime.     Singulair (montelukast): This is a mast cell stabilizer that works well with antihistamines.  Mast cells are responsible for stimulating histamine production.  Reducing the activity of mast cells decreases the amount of histamines will be produced.  This reduces upper and lower respiratory inflammation caused by allergy  exposure.  I recommend that you take this medication at the same time you take your antihistamine.   Flonase (fluticasone): This is a steroid nasal spray that used once daily, 1 spray in each nare.  This works best when used on a  daily basis. This medication does not work well if it is only used when you think you need it.  After 3 to 5 days of use, you will notice significant reduction of the inflammation and mucus production that is currently being caused by exposure to allergens, whether seasonal or environmental.  The most common side effect of this medication is nosebleeds.  If you experience a nosebleed, please discontinue use for 1 week, then feel free to resume.  If you find that your insurance will not pay for this medication, please consider a different nasal steroids such as Nasonex (mometasone), or Nasacort (triamcinolone).  I have included some information below about asthma that I hope you find helpful.  I have sent prescriptions for all of your medications to your pharmacy with refills enough to last you 6 months.  It is very important that you follow-up with your primary care provider on a regular basis.  With your health insurance, you can see a regular provider at regular intervals, typically every 3 months, for regular monitoring of your asthma.  If you do not currently have a primary care provider in this area, please use the QR code on the last page of this visit summary to reach [facility] appointment scheduling website.  You can schedule yourself appointment online.   ??????? ???? ? ?????? ???????? ?? ???????? ?? ??? ? ???? ???????? ????? ????? ????? ?? ?? ?? ??????? ??? ?? ????? ? ??? ????? ?? ????? ???? ?? ???? ?? ?? ??? ?? ????? ????:  Depo-Medrol IM (methylprednisolone): ????? ? ??? ?? ????? ?????? ?? ???? ??? ?? ???? ?????? ???? ?? ?? ?? ???? ?? ? ???? ?????? ??????? ????? ??. ???? ???? ? ???????? 8 ??? ?? 12 ??????? ???? ? ?????? ???? ??? ????? ???? ?? ???? ?????. Medrol Dosepak (methylprednisolone): ?? ?? ?????? ?? ?? ????? ?????? ?? ???? ????? ???? ?? ? ??? ?? ???? ???. ??????? ???? ??? ??? ? ???? ? ????? ??? ?? ???? ????????? ????? ? ??? ???? ??? ? ???? ??????? ????.  Symbicort  (budesonide and formoterol): ??????? ???? ?? ??? ?? ??? ??? 2 ?? ???? ???. ?? ???? ??? ???? ? ????????????? ?? ? ???????? ????????? ??? ???. ???? ??? ?????? ?? ?? ???? ?? ??? ?? ?? ??? ???? ?? ? ?????? ????? ???? ?? ???? ??? ? ???? ? ??? ??? ???? ??? ? ????? ? ?? ???? ?? ? ??? ????????. ???? ??? ????????????? ? ?????? ?????? ??????? ?? ??? ?? ??? ? ??? ???? ?? ???? ???? ???? ?? ? ?? ???? ??? ?? ??? ???? ????? ? ??? ???? ? ?????? ???? ??? ???. ???? ??? ?????? ???????? ? ??? ??? ???? ???????? ?? ??? ??? ??? ?? ????? ? ?????? ??? ????????? ?? ????? ???? ??? ? ??? ?????? 24 ????? ???? ???? ??? ?? ????? ????? ???? ?????? ?? ???????? ????? ? ??? ??? ?????? ?????? ???? ?? ????? ? ?? ??????? ????? ?? ??? ? ?? ??????? ????? ???? ???. ??????? ???? ???? ????? ???? ? ??? ???? ? ???? ?? ???? ????? ? ???? ?? ????? ?? ? ????? ?? ???? ?? ? ??? ??? ?????? ?????? ?????? ?? ???? ?????.  ProAir? Ventolin? Proventil (albuterol): ?? ????? ???? ? ??? ??? ???? ??????? ??????????? ???. ?? ???? ?? ??? ?? ? ??? ? ???? ??? ????? ??????. ??? ?? ?? ????? ??? ??? ???? ??? ?? ????????. ? ??? ?????? ? ???? ???? ????? ? ??? ???? ?? ? ???? ?? ???? ??. ?? ?? ??? ??? ?????? ???? ?? ?? ?? ???  4-6 ??????? ?? ? ????? ??? ? ????????? ? ??? ????? ???? ?? ??? ???? ????? ????? ???? ??. ?? ? ???? ??? ????????? ?? ???????? ?? ?? ??? ?? ????. ?? ???????? ???? ?? ? ???????? 3 ??? ?? 4 ???? ????? ?? ???? ?? ??? ?? 4 ??? ?? ???? ??? ??? ?? ????? ???? ??? ?? ??? ?? ??? ??? ?? ?? ?? ? ????? ?? ???? ?? ?? ??? ???? ?? ??? ????? ?? ????? ???? ?? ??? ?? ?? ?? ?? ???? ??? ?? ???? ??.  Zyrtec (cetirizine): ?? ?? ???? ???? ??? ???? ??????? ?? ?? ? ???????? ????????? ?? ?????? ? ????? ??????? ?????? ????? ?? ????? ???. ?? ???? ???????? ??? ?? ???? ???? ?? ? ???? ? ??? ???? ??? ?? ?? ??????? ??? ?? ???? ??? ??? ? ??? ?? ??? ?? 1 ?????? ?????.  Singulair (montelukast): ?? ? ???? ??? ????????? ?? ?? ? ???? ??????? ??? ?? ??? ???. ???? ???? ? ??????? ????? ????? ??????  ???. ? ???? ???? ?????? ???? ? ??????? ????? ???? ?? ????? ????. ?? ? ????? ? ???????? ?? ???? ? ?????? ?? ???? ????? ?????? ????. ?? ??????? ??? ?? ???? ?? ???? ?? ???? ??? ?? ????? ?? ???? ??? ???? ??????? ????.  Flonase (fluticasone): ?? ?? ?????? ??? ???? ?? ?? ?? ??? ?? ?? ?? ????? ????? ?? ?? ??? ?? 1 ????. ?? ???? ??? ??? ??? ?? ??? ??? ????? ????. ?? ???? ?? ??? ?? ??? ?? ???? ?? ????? ??? ??? ?????? ?? ??? ?? ???? ??? ??? ???? ???? ????? ???. ? 3 ??? ?? 5 ???? ???? ? ?????? ??????? ???? ?? ? ?????? ?? ???? ????? ?? ? ??? ?? ???? ????? ?? ?? ???? ? ?????? ??? ?? ????? ?? ????? ?? ???????? ??. ? ?? ????? ?????? ??? ?????? ????? ? ???? ???? ??. ?? ???? ? ???? ???? ????? ???? ??????? ???? ? 1 ???? ????? ????? ??? ???? ??? ? ??? ??? ???? ????? ???? ????? ????. ?? ???? ????? ?? ????? ???? ?? ? ?? ????? ????? ???? ??????? ??????? ???? ? ??? ????? ????????? ?? ??? ?? ????? ??? Nasonex (mometasone)? ?? Nasacort (triamcinolone).  ?? ????? ? ???? ?? ??? ???? ??????? ???? ??? ?? ?? ?? ???? ??? ?? ???? ???? ?????. ?? ????? ??????? ?? ????? ? ???? ????? ???? ????? ?? ?? ???? ?? ? 6 ?????? ???? ????? ???? ????. ?? ???? ???? ?? ?? ???? ?? ???? ??? ? ??? ?????? ??????? ???? ????? ??? ????? ???. ????? ? ?????? ???? ???? ???? ???? ?? ?? ???? ?????? ??? ?? ?????? ???? ?? ??? 3 ?????? ??? ????? ? ????? ????? ????? ????? ?? ???? ???? ????? ?????. ?? ???? ??? ???? ??? ???? ?? ?????? ??????? ???? ????? ????? ??????? ???? ? ?? ????? ????? ?? ?????? ?? ?? ? QR ??? ?????? ???? ? ????? ?????? ????? ??????? ????? ??????? ??? ???? ?? ??????. ???? ???? ?? ??? ??? ?????? ??????? ???.  ???? ?? ????????? (?????) ???? ?? ?? ? ?????? ???? ???? ???? ?? ??? ?? ?? ?? ??? ?? ??? ????? ???? ??? ?? ??? ????. ??????? ? ??? ????? ???? ?????? ? ??? ?????? ? ???? ?? ?????? ?? ???? ???????? ????. ? ???? ?????? ?? ? ???? ? ???? ?? ? ???? ????? ?? ??? ?? ??????? ????? ?? ? ???? ??? ??? ? ???? ???? ?? ??? ?? ? ??? ?? ????? ???????? ???? ???? ??????  ??? ?? ???? ???? ??? (??????)? ? ??? ????? ?? ? ???? ???. ????? ???? ???? ????? ???? ????? ??? ?? ? ???? ?? ???? ?? ???? ???????? ????. ? ???? ?? ????? ??? ???? ??? ??????? ???? ??. ? ????? ???? ????? ?? ?? ????? ??? ? ???? ???????? ??. ???? ? ?????? ?? ?? ??? ??? ???? ?? ? ???? ??? ????? ???? ?? ? ?? ?? ?????? ?? ????? ???? ?? ? ???? ??????? ?????? ????. ?? ???? ?? ?? ????? ? ??? ???? ?? ?? ???? ?????? ??? ???? ?? ???? ????? ?? ????? ???? ?? ?????? ??? ???.  ??????? ?? ?? ??? ???? ?????? ???? ?? ?? ???? ? ?????? (??????) ?? ????????? ?????? ?? ???? ???????? ???? ??? ???? ??? ?? ????? ?? ??. ?? ?? ???? ?? ? ?????? ???? ???????? ???? ???? ???? ???? ?? ? ?????? ???? ???????? ??? ?? ??? ????? ???. ?? ?????? ? ?? ??? ????? ????? ???. ??? ??????? ????? ?? ??:  ?????? ?? ???????? ???? ??? ??????? ????? ? ????? ????? ??????? ????? ? ??? ??????? ?? ??????? ???.  ? ?????? ????.  ? ??? ????? ?? ??? ???.  ???? ?? ??? ???????? ????????? ??? ??? ?? ??? ????.  ???? ???? ??? ?????? ?? ???? ??????.  ??????? ?? ??????? ????? ??? ????? ????? ???? ???? ?? ? ??? ? ??? ?????? (???????).  ??? ?? ????? ?? ??? ??? ????? ????? ?? ? ???? ? ???? ??? ? ????? ???? ?????? ?? ?? ????? ?????? ???? ?? ?? ????? ?? ? ??? ???? ????? ????. ??? ??? ?? ??? ????? ????? ?? ??:  ????.  ????? ?????? (? ??? ????).  ? ??? ??? ??? ?? ???? ???? ????.  ? ???? ???????.  ????? (?????) ? ?? ?????? ???.  ?? ????? ???? ?? ? ???? ???? ????.  ? ????? ?????? ???.  ?? ???? ?????? ????? ?????? ????? ??? ??? ????? ? ??? ?????? ??? ?????? ???? ??. ?????? ?????? ???? ??:  ????? ? ????? ??????? ??????? ?? ??????.  ???? ??? ????. ??? ???? ?????? ? ???? ? ?????? ????? ????? ????? ? ??? ???? ??? ???:  o ??????? ????. ?? ? ???? ? ??? ????? ?????? ?? ????? ???. ??? ??? ??? ?????? ????.   o ????? ??? ????? ?? ? ?????? ????. ?? ?? ???? ??? ? ?????? ??? ??? ???. ??? ? ????? ??? ?? ????? ???? ?? ???????? ???? ???? ???.  ? ???? ????? ????? ???:   o ?  ????? ????? ??? ???? ???????? ?? ????? ? ?????? ???? ? ?????? ???? ???????? ????.   o ??????? ???? (???????????????). ?? ??? ???? ?? ?? ? ?????? ????? ?????? ?? ????? ???.  ? ???? ??? ???? ?????. ? ???? ??? ???? ????? ? ?????? ???? ????? ???? ?? ?????? ????? ???? ??? ???? ??. ??? ???? ?? ???? ??:   o ????? ? ???? ? ??????? ???? ?? ? ???? ??? ? ?????? ????????.   o ? ?? ?? ??? ??????? ?? ??? ???? ???? ??????? ?? ?? ??? ? ??? ??? ???? ??? ??.   o ? ????? ?????? ?? ??? ???????? ?? ? ??? ????? ???? ?? ??? ??????. ? ??? ????? ???? ?????? ??? ?? ??? ????? ?? ??? ??? ?? ????? ? ?????? ???. ?? ????? ??? ????? ? ???? ????? ?? ????? ???.  ?? ??? ?? ?? ???????? ????? ???:  ? ???? ?? ???? ???? ????? ??? ??? ????? ??? ???? ?? ????? ? ?????? ??????? ???? ????? ???? ??? ???.  ? ???? ????????? ?? ??? ???? ???? ??? ???? ?? ????? ? ?????? ??????? ???? ????? ???? ??????? ???? ????? ? ??? ?? ???? ??? ?????????.  ? ??? ????? ???? ?????? ?? ? ??? ??? ????? ?????? ????? ?????.  ? ????? ? ?????? ? ?????? ? ?????? ????? ? ??? ??? ???? ??? ?? ??????.  ???? ?? ???? ?? ?? ?? ?? ????? ????? ?? ????? ?? ??? ?? ???? ????. ? ?????? ??????? ???? ????? ??? ????? ????? ??:  ???? ????? ??? ????? ?? ???? ??? ?? ????? ?? ???? ?? ?????.  ????? ???? ? ????? ?????? ???? ????? ??? ????? ????? ?????? ?? ????? ??????.  ???? ????? ??? ?? ?? ???? ?? ?? 2-3 ??? ??? ??? ???? ???? ??????.  ? 1 ???? ????? ????? ? ??? ???? ???????? ?????? ????? ? ??? ????? ????? ???? ????? ? ???? ???? 50-79? ???? ??.  ???? ??? ?? ? ??? ???? ???. ?????? ????? ?????? ??? ??:  ? ????? ? ???? ?? ??? ?? ???? ???? ???? ?? ?????? ?? ???? ?? ?????.  ??? ?? ?? ??????? ?? ???? ?? ??? ?? ??? ?? ????? ?????? ??? ???? ???? ???.  ???? ?? ?????? ???? ?? ???? ???? ?? ???? ???.  ???? ? ???? ??? ?? ???? ???.  ???? ? ??? ????? ???? ?? ?????? ???? ???.  ????? ? ????? ?? ?????? ??? ??? ??? ???.  ???? ??? ???? ?? ??? ????? ?? ???? ?? ???? ????.  ????? ? ??? ????? ????? ????? ?  ???? ???? 50? ??? ?? ??.  ???? ?? ?????? ??? ???? ???? ????? ??? ???? ????? ???. ?? ??? ????? ????? ?? ????? ??. ?????? ????? ?????? ???. 911 ?? ??? ????.  ?????? ?? ??? ???? ????? ?? ??? ????? ?? ???? ??.  ??? ?????? ?? ?? ???.  ?????  ???? ?? ????????? (????) ???? ?? ?? ? ?????? ???? ???? ???? ?? ??? ?? ?? ? ??? ???? ??? ?? ???? ????. ? ???? ??????? ?? ? ?????? ???? ?? ? ???? ????? ?? ??? ????? ? ????? ????? ??? ????? ?? ? ???? ??? ???? ???? ??.  ? ????? ?????? ?????? ?? ????? ??? ???? ?? ? ???? ??? ????? ???? ?? ? ?? ?? ?? ?????? ?? ????? ???? ?? ? ??? ???? ??? ?????.  ??? ?????? ??? ?? ???? ?????? ?? ???? ? ??????? ??? ?????? ???? ?? ???? ?? ??? ??? ???? ??????.  ? ????? ???? ????? ?? ?? ????? ??? ? ???? ???????? ??. ??????? ????? ?????? ??? ?? ???? ? ?????? ???? ???? ?? ????? ? ???? ?????? ????? ??? ?????? ?? ???? ??? ????.  meherbani wakri da darmalo, khurakuno ao frikooncy paa ara da nooro malumato lapara landy waloli chi zaa yay worandiz kom chi staso da nakho kamolo ke marsta wakri ao taso zar tar zra kha ihsas wakri: Depo-Medrol IM (metheelprednisolone): staso da pam war tanfse altahhab paa chataki sara hal kolo lapara, taso taa nan paa daftar ke da solo medrol engiction darkral sho.  taso bayed da ratlunko 8 sakha tar 12 saatuno pory da strid bashpar gaty ihsas kolo taa dawam warkri.  Medrol Dosepak (metheelprednisolone): da yo strid di chi staso portani ao khkata valvae lary baa da pam war aram kri.  meherbani Kandice Hams da sehehar da khwaro sara yo qutar Synthia Innocent da saba sehehar sakha da naskhi bashparido pory.    Symbicort (budesonide and formoterol): meherbani wakri paa warz ke dawa zala 2 pef tanfs kri.  da tanfs shawi darmal da korticostrid ao da ulbotrol ugdamahhala banra lari.  tanfs shawi strid ao da darmal paa badan ke naa juzb kegi ao da arkhizo aghizo lamal naa kegi laka da weny da shkar Jodi Marble lurha kol , Ricky Stabs , bi khobai ya da wazan zyatwali.  tanfs shawi  korticostrid da mozooe strid [employer] paa ser Advertising account executive , laka sanga chi taso taswer Kandace Blitz , da amle nada chi valasa wakri staso da sago danana da strid krem mash kri.  ugd omal koonki ulbotrol da land omal kolo ulbotrol paa ser kar kawi chi staso da zhghorni sa akistunki ke moondal kegi magar da narm ozlato 24 saata aram chamto kawi chi staso valvae lary khlasavi ao mahdudvi; staso da land omal reskio anahhilar koli shi yawazy da so saatuno Landscape architect so saatuno lapara [employer].  meherbani wakri warhya ihsas wakri da sa landi , tokhi ao tokhi lapara da warze paa ugdo ke da artya paa surat ke da land omal reskio anahhilar karolo taa dawam warkri. ProAir, Ventolin, Proventil (albuterol): da tanfse darmal da land omal beta agunist brunkadeliter lari.  da darmal paa sago ke da valva da lary narm ozlat aramvi.  kala chi da ozlat tang wee, tanfs noor valm mahdudegi.  da narm ozlato da aram kolo paila da valva harkat ao da tanfs kha wali di.  da yo land omal koonki darmal di chi paa valro 4-6 saatuno ke da tanfse kar da zyatwali, da sa [place], tokhi ao der tokhi lapara karol kedi shi.  da da lasi sa akistunki ya neebolizar hal paa shkal ke raze.  ma sparkhtana wakra chi da ratlunko 3 sakha tar 4 wrho pory, da darmal paa Margaretmary Bayley ke 4 zala paa takal shawi wakht ke karol kegi bia paa warz ke dawa zala kam shi ao da artya paa surat ke tar valagha pory chi nakhe nakhani paa bashpra toga hal shawi wee chi zaa yay atcal kom so awani wee. Zyrtec (cetirizine): da yo ghura dawahmm nasal intee valastamin di chi da chaperyal alarjinuno paa worandy da tanfse altahhabi ghabargun kamolo ke [place].  paa zeeno naroghano ke, da darmal koli shi da warze da khob lamal shi, no zaa worandiz kom chi taso valara warz da khob paa wakht ke 1 tableet wakhuri.   Singulair (montelukast): Musician mast sel stibalaezar di chi da intee valastamin sara kha [place].  musty hujri da valastamin tolid valasolo musuliat lari.  da mast hujro  faaliat kamol da valastamin maqdar kamwe chi tolid [place].  Air traffic controller sargandido laa amla da portani ao khkata tanfse altahhab kamwe.  zaa worandiz kom chi taso  da darmal paa warta wakht ke wakhuri chi taso khpal intee valastamin [place]. Flonase (fluticasone): da yo strid nak spari di chi paa Margaretmary Bayley ke yo zal karol kegi, paa valar nari ke 1 spari.  da ghura kar kawi kala chi valara Rosaland Lao. da darmal kha kar naa Rexanne Mano chery da yawazy valagha wakht wakarol shi kala chi taso fakar koe taso warta Manning Charity.  da 3 sakha tar 5 wrho pory da [employer], taso baa da altahhab ao balgham tolid ke da pam war kamakht ogorai chi da mehehal da alarjin sara makh kegi, kaa mosmi ya chaperyal wee.  da day darmalo tartolo am arkhiza [employer] da pozi wena daa.  Sherryle Lis taso da pozi wena tajarba kri, meherbani wakri da 1 awani lapara karol band [place], bia da bia pel kolo lapara warhya ihsas [place] taso wamumi chi staso bema baa da day darmalo lapara pesy warnakri, Reece Levy da nak makhtalf stridona paa pam ke wanisai laka Nasonex (mometasone), ya Nasacort (triamcinolone). ma landy da isma paa ara zeeny malumat shamal kri dee chi zaa amid larm chi taso gatur wamumi.  ma staso darmaltun taa staso da tolo darmalo naskhi legali dee chi taso taa da 6 miashto dawam lapara kafi dakol.  da [place] Slider maahmma daa chi taso paa manzam dol da khpal lumrani pamlrni chamto konki sara taqib kri.  staso da roghtia bemy sara, taso koli shai paa manzam wafono ke, paa zangri toga paa valro 3 miashto ke, staso da salmy manzmi sarni lapara yo manzam chamto konki ogorai.  kaa taso os mehehal pady sima ke lumrani pamlrni chamto konki nalarai, meherbani wakri da day ledni landiz paa wrusti makh ke da QR kod wakarwai tarso da makhrut roghtia berani pamlrni ledni mehehalwesh web panry taa warsegy.  taso koli shai khpal zan aanlin mehehalwesh kri. isma yo ugdamahhala (mazmana) halat di chi da takrari pekho lamal kegi paa kom ke chi paa sago ke  tet valvae lary tang ao tang kegi. tangwali da tet valvae lary shaokhwa da narm ozlato da sozash ao sakhtedo laa amla raminzta kegi. da isma Leota Sauers chi da isma da hamli ya da isma faleerz paa num valm yadegi, kedai shi da tokhi subbb shi, da tanfs kolo paa wakht ke da lurh ghag ghagona raminzta [place], deri wakhtuna kala chi taso tanfs koe (ghoridal), da sa landi, ao da siny dard. valvae lary mumkin izafi [place]gham tolid kri chi da sozash ao kharsh laa amla raminzta kegi. da bred paa jaryan ke, tanfs kol stunzman kedi shi. da salmy hamli kedai shi laa kochani sakha da jwand gwasunki wee. isma da darmalany war naa daa, magar darmal ao da jwand tarz badloon koli shi da day paa kantrol ke marsta wakri ao da shadid bredono darmalana wakri. da maahmma daa chi staso da sa landi paa kha toga kantrol kri tarso da halat staso paa warzani jwand ke madakhla wana kri. lamluna yay saa dee? dasee engiral kegi chi da halat da meraci (janiatik) ao chaperyali awamlo laa amla raminzta shawi, magar daqiq alat yay malum naa di. saa shi koli shi da salandi bred raminzta kri? deri shian koli shi da salandi bred [place] kri ya nakhe [place]. da [employer] da valar shahs lapara toper lari. am muharkuna ibarat dee laa:  alarjin ao kharkhonki mawad laka chanraski, dawary, da palto [place], [place], garda, da valva [place], ao [place] bawi.  da [employer].  da valva badloon ao sarha valva.  fashar ao qawi ihsasati ghabargunona laka jaral ya sakht [place].  zini darmal laka asparin ya beta balakers.  intanat ao altahhabi shraet laka zakam, zakam, sena baghal, ya da pazi da ghasha altahhab (rayenats). [employer] ao elaim saa dee? nakhe nakhani kedai shi da isma da mahrak sara da makhamakh kedo wrusta ya so saata wrusta woq shi ao kedai shi da shahs lukhwa toper walari. am nakhe ao nakhe nakhani ibarat dee laa:  tokhi.  tanfse stunza (da sa landi).  da shpee der wakht ya sehehar wakhti tokhi.  da siny sakhtwali.  starhya  (starhya) da lag faaliat sara.  paa bashparo jamlo ke da khbro kolo mashkal.  da tamrin kamzuri zgham. da sanga darmalana kegi? darmalana nashta, magar Bronson Curb da sami darmalany sara kantrol kedi shi. darmalana mamula shamal dee:  staso da salmy muharkato pejandana ao mahnewi.  tanfs shawi darmal. dawa dola Hermelinda Dellen da isma da darmalany lapara [place]berah [place], da shadat pory ara lari:  o Arvin Collard da da isma da nakho nakhano mahnewi ke Teressa Lower. dwi valara warz akistal kegi.  o garandi omal konki Social worker. da paa chataki sara da salandi nakhe lary [place]. dwi da artya sara sam karol kegi ao landemahhala rahat chamto kawi.  da nooro darmalo karol laka:  o da alarji darmal, laka intee valastamin, kaa staso da salandi hamli da alarjin lukhwa raminzta kegi.  o muafiati darmal (amunomodoltrona). da valagha darmal dee chi da muafiat sistam kantrol ke Teressa Lower.  da salim omal plan jorol. da salim omal plan staso da salandi hamli edara kolo ao darmalany lapara lekal shawi plan di. pady plan ke shamal dee:  o staso da salim da muharkato lest ao da valaghwe sakha da mahnewi sarangwali.  o da day paa ara malumat chi kala darmal bayed wakistal shi ao kala da dwi doz bayed badal shi.  o da wasely karolo paa ara lerkhaoni chi da lurh jaryan meter paa num yadegi. da lurh jaryan meter [place] kawi chi sugi somra kha kar kawi ao staso da salandi shadat. da staso sara staso da halat sarna ke Teressa Lower. paa kor ke da lerkhaoni taqib kri:  da naskhi ao naskhi darmal yawazy valagha dol wakhle laka sanga chi staso da roghtia pamlrni chamto konki lukhwa wel shawi.  da tolo vaccinuno paa ara taza osai laka sanga chi staso da roghtia pamlrni chamto konki lukhwa worandiz shawi, pashmol da flo ao sena baghal vaccinuna.  da lurh Research scientist (physical sciences) da khpal lurh jaryan lustalo taqib wasatai.  da salmy da naroghy da khpredo da mahnewi lapara da khpal omal plan dark ao wakarwai.  eskrt maa sakwe  ao naa cha taa ejaza warkri chi staso paa kor ke eskrt sakawi. da roghtia pamlrni chamto konki sara areka wanisai kaa:  taso tokhai, sa landi, ya tokhi larai chi darmalo taa zwab naa warkawi.  Georgann Housekeeper darmal da janbi awarzu lamal garzi, Candace Gallus, parasob, ya tanfse stunza.  taso artya larai chi paa awani ke laa 2-3 zala sakha der aram darmal wakarwai.  da 1 saat lapara staso da [employer] plan taqibolo wrusta staso da lurh jaryan lustal lahhem staso da shahci ghura 50-79% pory dee.  taso tba ao da sa landi larai. samdesti marsta tarlasa kri kaa:  da salmy da hamli paa wakht ke taso badatar kerae ao darmalany taa zwab naa warkoe.  kala chi paa istrahat ke yast ya kala chi der lag pysiki faaliat koe taso tanfs koe.  taso paa khoralo, sakhlo ya khbro kolo ke mashkal larai.  taso da siny dard ya sakhti  Renee Rival da zra garandi takan ya darzuna paida koe.  staso da shundo ao nukano rang shin rang lari.  taso spak yast ya chkar yast, ya taso bi valukha yast.  staso da lurh jaryan lustal staso da shahci ghura 50% sakha kam dee.  taso paa noormal dol tanfs kolo lapara der stari ihsas koe. da nakhe nakhani kedai shi berani wee. samdesti marsta tarlasa kri. 911 taa zang wowauhi.  intizar maa koe tarso ogorai chi nakhe nakhani laa [employer].  zan roghtun taa maa Zenon Mayo  isma yo ugdamahhala (mazman) halat di chi da takrari pekho lamal kegi paa kom ke chi da valva lary tang ao tangi kegi. da Hazle Quant, chi da salandi hamli ya da isma faleerz valm wel [place], da tokhi, tokhai, sa landi, ao da siny dard lamal kedi shi.  da salmy naroghy darmalana naa kegi, magar darmal ao da jwand tarz badloon koli shi da day paa kha kantrol ke marsta wakri ao da sa landi makha wanisi.  dad tarlasa Comer [place]ocket chi taso poheegy chi sanga da muharkato sakha mahnewi wakri ao sanga ao kala khpal darmal wakarwai.  da salmy hamli kedai shi laa kochani sakha da jwand gwasunki wee. samdalasa marsta tarlasa kri kaa taso da  salandi hamla walarai ao staso da adi zhghorni darmalo sara darmalany taa zwab wana waye.    Disposition Upon Discharge:  Condition: stable for discharge home  Patient presented with an acute illness with associated systemic symptoms and significant discomfort requiring urgent management. In my opinion, this is a condition that a prudent lay person (someone who possesses an average knowledge of health and medicine) may potentially expect to result in complications if not addressed urgently such as respiratory distress, impairment of bodily function or dysfunction of bodily organs.   Routine symptom specific, illness specific and/or disease specific instructions were discussed with the patient and/or caregiver at length.   As such, the patient has been evaluated and assessed, work-up was performed and treatment was provided in alignment with urgent care protocols and evidence based medicine.  Patient/parent/caregiver has been advised that the patient may require follow up for further testing and treatment if the symptoms continue in spite of treatment, as clinically indicated and appropriate.  Patient/parent/caregiver has been advised to return to the [facility] or PCP if no better; to PCP or the Emergency Department if new signs and symptoms develop, or if the current signs or symptoms continue to change or worsen for further workup, evaluation and treatment as clinically indicated and appropriate  The patient will follow up with their current PCP if and as advised. If the patient does not currently have a PCP we will assist them in obtaining one.   The patient may need specialty follow up if the symptoms continue, in spite of conservative treatment and management, for further workup, evaluation, consultation and treatment as clinically indicated and appropriate.  Patient/parent/caregiver verbalized understanding and agreement of plan as discussed.  All questions were addressed during visit.  Please see  discharge instructions below for further details of plan.  This office note has been dictated using Teaching laboratory technician.  Unfortunately, this method of dictation can sometimes lead to typographical or grammatical errors.  I apologize for your inconvenience in advance if this occurs.  Please do not hesitate to reach out to me if clarification is needed.      Theadora Rama Scales, [place] 10/20/22 [phone]

## 2022-11-03 ENCOUNTER — Encounter: Payer: Self-pay | Admitting: Internal Medicine

## 2022-11-03 ENCOUNTER — Ambulatory Visit: Payer: Commercial Managed Care - HMO | Admitting: Internal Medicine

## 2022-11-03 ENCOUNTER — Other Ambulatory Visit: Payer: Self-pay

## 2022-11-03 VITALS — BP 114/76 | HR 75 | Temp 97.9°F | Ht 68.0 in | Wt 156.0 lb

## 2022-11-03 DIAGNOSIS — F329 Major depressive disorder, single episode, unspecified: Secondary | ICD-10-CM | POA: Diagnosis not present

## 2022-11-03 DIAGNOSIS — H6691 Otitis media, unspecified, right ear: Secondary | ICD-10-CM

## 2022-11-03 DIAGNOSIS — H9203 Otalgia, bilateral: Secondary | ICD-10-CM

## 2022-11-03 MED ORDER — AZITHROMYCIN 250 MG PO TABS: ORAL_TABLET | ORAL | 0 refills | Status: AC

## 2022-11-03 MED ORDER — SERTRALINE HCL 25 MG PO TABS: 25.0000 mg | ORAL_TABLET | Freq: Every day | ORAL | 2 refills | Status: DC

## 2022-11-03 MED ORDER — CIPROFLOXACIN HCL 0.3 % OP SOLN: 1.0000 [drp] | OPHTHALMIC | 0 refills | Status: DC

## 2022-11-03 MED ORDER — MONTELUKAST SODIUM 10 MG PO TABS: 10.0000 mg | ORAL_TABLET | Freq: Every day | ORAL | 1 refills | Status: DC

## 2022-11-03 NOTE — Assessment & Plan Note (Signed)
Patient describes chronic stable depression for the past several years. He has been on Lexapro 5mg , which he feels has not been very helpful in improving his mood. He has also been taking hydroxyzine often, which he dislikes because it makes him sleepy. He has an upcoming appointment with behavioral health. He does not want to go up on his Lexapro, and wonders if there are any other options.  -Start Zoloft 25mg  with behavioral health follow-up -Counseled on taking hydroxyzine only as needed during times of anxiety
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
## 2022-11-03 NOTE — Patient Instructions (Addendum)
 ?? ? ???? ?? ?? ?? ???? ?? ??? ?????.   ????? ? ??? ??? ?????? ?? ?   Azithromycin ?? ??? ?? ???? ?????? ??????? ???. ??????? ???? ?? ???? ??? ???? ???? ?????. ?? ? ???? ?????? ????? ?? ??????? ??? ?? ???? ???? ?? ?? ??? ?? ??? ?? ??? ?? ??? ??? ?? ???? ??? ?? ???? ?????. ?? ? ENT ??? ???????? ?? ?? ?? ????? ???? ?? ???? ??? ? ???? ??? ????? ???.   ????? ? ????? ?????? ?? ????? ? ???????? ?? ??? ? ?????? ?? ??? ???? ??? ???. ???? ???? ?? ? ????? ?????? ?? ??? ?? ????? ??? ?? ?? ???? ???? ??? ???.   ??????? ???? ?????? ?? ??? ???? ?? ???? ???? ?????? ???.   ?????  ????? ????? khaghli Buyer, retail khukhi war waa chi taso nan warz walidal.   staso da ghog dard lapara, zaa da Azithromycin paa num yo intee biotik worandiz kom. meherbani wakri da darmal tol panza warze Willette Pa da intee biotik saski valm worandiz kom chi taso koli shai paa khpal khi ghog ke zai paa zai kri kaa taso dard taa dawam warkri. zaa da ENT ghog mutaksicino taa valm yo pegham legam chi taso sara da ledo wakht tartib kri.   staso da khpagan lapara, zaa staso da liksapro par zai da zoloft paa num darmal pel kom. taso [employer] shai da rawani darmalany paa wakht ke taqib kri chi da darmal sanga Marylouise Stacks.   meherbani wakri klinik taa zang wowauhi kaa taso koma pokhtana Nigel Berthold,  docter Patsie Mccardle
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
## 2022-11-07 ENCOUNTER — Encounter (INDEPENDENT_AMBULATORY_CARE_PROVIDER_SITE_OTHER): Payer: Self-pay | Admitting: Otolaryngology

## 2022-11-13 ENCOUNTER — Ambulatory Visit (HOSPITAL_COMMUNITY): Payer: Managed Care, Other (non HMO) | Admitting: Psychiatry

## 2022-12-08 NOTE — Progress Notes (Unsigned)
BH MD/PA/NP OP Progress Note  12/09/2022 12:59 PM Eric Mcbride  MRN:  [id number]  Visit Diagnosis:    ICD-10-CM   1. GAD (generalized anxiety disorder)  F41.1 Vilazodone HCl (VIIBRYD) 10 MG TABS    gabapentin (NEURONTIN) 100 MG capsule    2. Mild episode of recurrent major depressive disorder (HCC)  F33.0 Vilazodone HCl (VIIBRYD) 10 MG TABS       Assessment: Eric Mcbride is a 31 y.o. male with a history of anxiety who presented to [facility] for initial evaluation on 10/28/21.    During initial evaluation patient reported symptoms of depression and anxiety including low mood, anhedonia, amotivation, decreased energy, poor concentration, constant worry, social anxiety, and physical symptoms with anxiety including palpitations, shortness of breath, diaphoresis, and nausea.  He denied any suicidal ideation or thoughts of self-harm along with any history of mania, psychosis, paranoia, or delusions.  Psychosocially patient is an immigrant from [place] and moved here with his brother 2 years ago.  His wife and son are still in [place] and have been unable to come here despite patient wanting them to.  He has a partial understanding of English which has led to him being more isolated.  At this time patient meets criteria for MDD and generalized anxiety disorder he also has a number of traits consistent with social anxiety disorder.  Eric Mcbride presents for follow-up evaluation. Today, 12/09/22, patient reports intermittent fluctuations in his anxiety and depression.  While on the Lexapro things have been stable however he notes onset of headaches, anhedonia, and nausea after stopping the medication.  Reviewed the mechanism of action and the likelihood of side effects when stopping and starting this medication.  Patient also endorsed some decreased libido secondary to the medication.  This being the case we suggested an alternative  antidepressant medication with less sexual side effects.  Patient was open to trying this today.  The importance of consistently taking the medication was discussed.  In regards to hydroxyzine he finds benefit but it is over sedating.  We will discontinue it today and trial gabapentin for as needed anxiety symptoms.  Risk and benefits of this medication were discussed.  Patient will follow up in a month.  He also filled out paperwork to allow his case manager to be a primary contact point.  Plan: - Start Vilazodone 10 mg daily - Start gabapentin 100 mg BID prn for anxiety - Discontinue Atarax 25 mg TID prn for anxiety, due to oversedation - Discontinue Lexapro 5 mg every day due to sexual side effects - Discontinue propranolol due to adverse side effects - CMP, CBC, and glucose reviewed - Crisis resources discussed - Refer for a neurologist - Follow up in a month  Chief Complaint:  Chief Complaint  Patient presents with   Follow-up   HPI: Interview was conducted with the assistance of a Teaching laboratory technician via telephone which patient was in agreement with.    Patient was last seen by this provider 10 months ago.  In that.  He had discontinued Lexapro as he had found it ineffective.  He had been taking hydroxyzine however had not like this as it made him feel sleepy.  Patient connected with his primary care doctor on 10/28 who discontinued Lexapro and started Zoloft 25 mg.  On presentation today patient reports that he had not started the sertraline prescribed by his primary care doctor due to concerns about dosing.  As for the Lexapro he has continued to  take it though not consistently.  When he does take it patient reports that he is doing all right with both stable anxiety and depression.  However on the days that he does not take the medication he feels awful with nausea, headache, and anhedonia/amotivation. Real reports that he feels the anxiety and depression are less of an issue now  compared to the past.  However he does still continue to experience somatic symptoms when off the medication.  This could be in part due to withdrawal symptoms as we explained the frequent initiation and discontinuation of serotonergic medication can result in the side effects.  It also is believed that his anxiety is contributing to some of his somatic concerns.  Patient has had GI workup which came back negative.  Of note patient did endorse decreased libido when taking the Lexapro which is a concern.  While he was in [place] he had fertility sting which showed low sperm motility.  We reviewed that antidepressants could potentially cause decreased libido or delayed orgasms that would not affect the motility.  Patient's wife is planning to move to the [place] in the near future.  Once she arrives if the 2 have trouble conceiving he was encouraged to reach out to a fertility specialist.  Discussed plan going forward including the discontinuation of Lexapro and initiation of Vilazodone due to the more favorable sexual side effect profile.  We also recommended discontinuing hydroxyzine as patient finds it oversedating when he uses it.  Suggested gabapentin as an alternative which she was agreeable to trying.  Risk and benefits of both medications were reviewed.  We also did review the importance of consistently taking the Vilazodone for it to have effect and to prevent potential withdrawal side effects from the medication.  Past Psychiatric History: Patient had former psychiatric care in [place] for anxiety and depression and was prescribed fluoxetine.  He was trialed on Remeron by his primary care provider with little benefit.  Patient denies ever being involved in therapy, he devise any suicide attempts, and he denies any prior psychiatric hospitalizations.  Patient was started on Prozac and propranolol however discontinued both in 7 and 3 days respectively due to symptoms of dizziness, nausea, and  vomiting.    Patient denies any substance use.  Past Medical History:  Past Medical History:  Diagnosis Date   Acid reflux disease    Constipation    Dyspepsia    H. pylori infection     Past Surgical History:  Procedure Laterality Date   NO PAST SURGERIES      Family Psychiatric History: Denies  Family History:  Family History  Family history unknown: Yes    Social History:  Social History   Socioeconomic History   Marital status: Married    Spouse name: Not on file   Number of children: 1   Years of education: Not on file   Highest education level: Not on file  Occupational History   Occupation: [employer]  Tobacco Use   Smoking status: Never   Smokeless tobacco: Never  Vaping Use   Vaping status: Never Used  Substance and Sexual Activity   Alcohol use: Not Currently   Drug use: Not Currently   Sexual activity: Yes    Birth control/protection: None  Other Topics Concern   Not on file  Social History Narrative   Not on file   Social Determinants of Health   Financial Resource Strain: Not on file  Food Insecurity: Patient Declined (08/14/2022)  Received from [facility] and [facility]   Hunger Vital Sign    Worried About Running Out of Food in the Last Year: Patient declined    Ran Out of Food in the Last Year: Patient declined  Transportation Needs: Patient Declined (08/14/2022)   Received from [facility] and [facility]   PRAPARE - Transportation    In the past 12 months, has lack of transportation kept you from medical appointments or from getting medications?: Patient declined    In the past 12 months, has lack of transportation kept you from meetings, work, or from getting things needed for daily living?: Patient declined  Physical Activity: Not on file  Stress: Not on file  Social Connections: Not on file    Allergies:  Allergies  Allergen Reactions   Augmentin [Amoxicillin-Pot Clavulanate] Other (See Comments)     Patient unsure    Current Medications: Current Outpatient Medications  Medication Sig Dispense Refill   gabapentin (NEURONTIN) 100 MG capsule Take 1 capsule (100 mg total) by mouth 2 (two) times daily as needed (anxiety). 60 capsule 2   Vilazodone HCl (VIIBRYD) 10 MG TABS Take 1 tablet (10 mg total) by mouth daily. 30 tablet 2   albuterol (VENTOLIN HFA) 108 (90 Base) MCG/ACT inhaler Inhale 2 puffs into the lungs every 6 (six) hours as needed for wheezing or shortness of breath (Cough). 54 g 1   budesonide-formoterol (SYMBICORT) 160-4.5 MCG/ACT inhaler Inhale 2 puffs into the lungs every 12 (twelve) hours. 3 each 1   cetirizine (ZYRTEC ALLERGY) 10 MG tablet Take 1 tablet (10 mg total) by mouth at bedtime. 90 tablet 1   ciprofloxacin (CILOXAN) 0.3 % ophthalmic solution Place 1 drop into both eyes every 2 (two) hours. Administer 1 drop, every 2 hours, while awake, for 2 days. Then 1 drop, every 4 hours, while awake, for the next 5 days. 5 mL 0   fluticasone (FLONASE) 50 MCG/ACT nasal spray Place 1 spray into both nostrils daily. 47.4 mL 1   methylPREDNISolone (MEDROL DOSEPAK) 4 MG TBPK tablet Take 24 mg on day 1, 20 mg on day 2, 16 mg on day 3, 12 mg on day 4, 8 mg on day 5, 4 mg on day 6.  Take all tablets in each row at once, do not spread tablets out throughout the day. 21 tablet 0   montelukast (SINGULAIR) 10 MG tablet Take 1 tablet (10 mg total) by mouth at bedtime. 90 tablet 1   No current facility-administered medications for this visit.     Musculoskeletal: Strength & Muscle Tone: within normal limits Gait & Station: normal Patient leans: N/A  Psychiatric Specialty Exam: Review of Systems  Blood pressure (!) 132/95, pulse 70, height 5\' 7"  (1.702 m), weight 152 lb (68.9 kg).Body mass index is 23.81 kg/m.  General Appearance: Fairly Groomed  Eye Contact:  Fair  Speech:  Normal Rate and translator present  Volume:  Increased  Mood:  Anxious  Affect:  Congruent  Thought Process:   Goal Directed  Orientation:  Full (Time, Place, and Person)  Thought Content: Logical   Suicidal Thoughts:  No  Homicidal Thoughts:  No  Memory:  NA  Judgement:  Fair  Insight:  Fair  Psychomotor Activity:  Normal  Concentration:  Concentration: Good  Recall:  Fair  Fund of Knowledge: Poor  Language:  unable to assess  Akathisia:  NA    AIMS (if indicated): not done  Assets:  Desire for Improvement Housing Talents/Skills  ADL's:  Intact  Cognition: WNL  Sleep:  Fair   Metabolic Disorder Labs: No results found for: "HGBA1C", "MPG" No results found for: "PROLACTIN" No results found for: "CHOL", "TRIG", "HDL", "CHOLHDL", "VLDL", "LDLCALC" Lab Results  Component Value Date   TSH 1.130 10/01/2020    Therapeutic Level Labs: No results found for: "LITHIUM" No results found for: "VALPROATE" No results found for: "CBMZ"   Screenings: GAD-7    Flowsheet Row [facility] Office Visit from 10/01/2020 in [facility]  Total GAD-7 Score 7 0      PHQ2-9    Flowsheet Row Office Visit from 11/03/2022 in [facility] Of [place]. [facility] Office Visit from 10/28/2021 in [facility] Office Visit from 05/29/2021 in [facility] - A Dept Of [place]. [facility] Office Visit from 01/04/2021 in [facility] - [facility] Dept Of Eligha [facility]idegroom [facility] Office Visit from 12/10/2020 in [facility] - [facility] Dept Of [facility]igh[facility] Bridegroom [facility]  PHQ-2 Total Score 0 3 2 0 0  PHQ-9 Total Score -- 16 -- 0 0      Flowsheet Row ED from 10/20/2022 in [facility] ED from 10/05/2022 in [facility] ED from 01/20/2022 in [facility] at [facility] RISK CATEGORY No Risk No Risk No Risk       Collaboration of Care:  Collaboration of Care:   Patient/Guardian was advised Release of Information must be obtained prior to any record release in order to collaborate their care with an outside provider. Patient/Guardian was advised if they have not already done so to contact the registration department to sign all necessary forms in order for [place] to release information regarding their care.   Consent: Patient/Guardian gives verbal consent for treatment and assignment of benefits for services provided during this visit. Patient/Guardian expressed understanding and agreed to proceed.   45 minutes were spent in chart review, interview, psycho education, counseling, medical decision making, coordination of care and long-term prognosis.  Patient was given opportunity to ask question and all concerns and questions were addressed and answers. Excluding separately billable services.   Stasia Cavalier, MD 12/09/2022, 12:59 PM

## 2022-12-09 ENCOUNTER — Ambulatory Visit (HOSPITAL_BASED_OUTPATIENT_CLINIC_OR_DEPARTMENT_OTHER): Payer: Commercial Managed Care - HMO | Admitting: Psychiatry

## 2022-12-09 ENCOUNTER — Encounter (HOSPITAL_COMMUNITY): Payer: Self-pay | Admitting: Psychiatry

## 2022-12-09 VITALS — BP 132/95 | HR 70 | Ht 67.0 in | Wt 152.0 lb

## 2022-12-09 DIAGNOSIS — F411 Generalized anxiety disorder: Secondary | ICD-10-CM

## 2022-12-09 DIAGNOSIS — F330 Major depressive disorder, recurrent, mild: Secondary | ICD-10-CM | POA: Diagnosis not present

## 2022-12-09 MED ORDER — VILAZODONE HCL 10 MG PO TABS: 10.0000 mg | ORAL_TABLET | Freq: Every day | ORAL | 2 refills | Status: DC

## 2022-12-09 MED ORDER — GABAPENTIN 100 MG PO CAPS: 100.0000 mg | ORAL_CAPSULE | Freq: Two times a day (BID) | ORAL | 2 refills | Status: DC | PRN

## 2022-12-16 ENCOUNTER — Encounter (INDEPENDENT_AMBULATORY_CARE_PROVIDER_SITE_OTHER): Payer: Self-pay

## 2022-12-16 ENCOUNTER — Telehealth (INDEPENDENT_AMBULATORY_CARE_PROVIDER_SITE_OTHER): Payer: Self-pay | Admitting: Otolaryngology

## 2022-12-16 IMAGING — CT CT ABD-PELV W/ CM
2 of 5 series · 15 of 46 positions shown, 17 images · IV contrast (OMNIPAQUE)
Comparison: None.

CLINICAL DATA: Left-sided abdominal pain with nausea and weight
loss

EXAM:
CT ABDOMEN AND PELVIS WITH CONTRAST
TECHNIQUE: Multidetector CT imaging of the abdomen and pelvis was performed
using the standard protocol following bolus administration of
intravenous contrast.

[Series 2: axial st · axial · 0.67mm/px · z∈[-501,-101]mm · 12 of 90 slices shown, 14 images]
[im 5/90  soft-tissue]
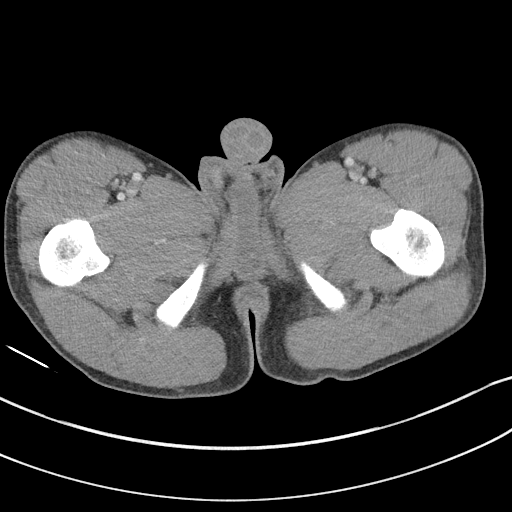
[im 5/90  bone]
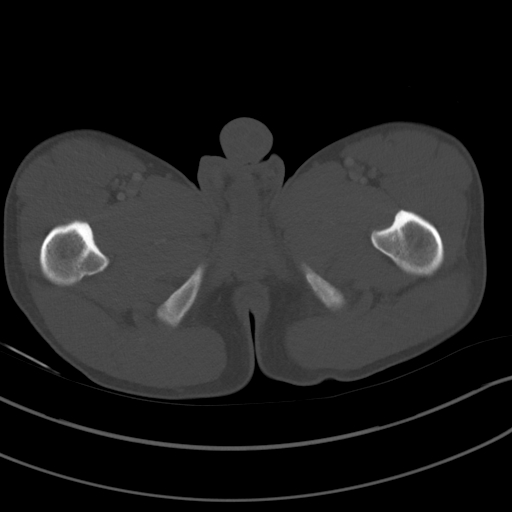
[im 15/90  soft-tissue]
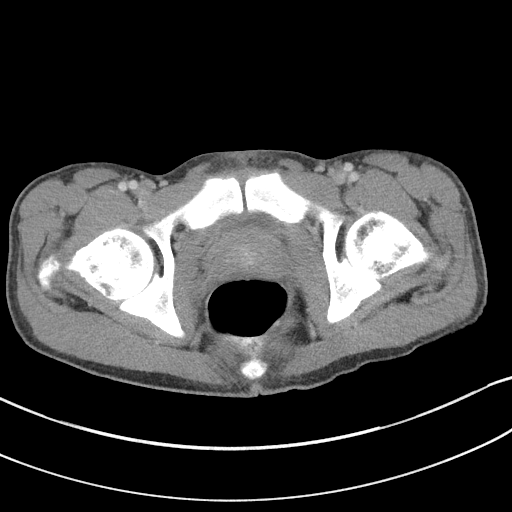
[im 19/90  soft-tissue]
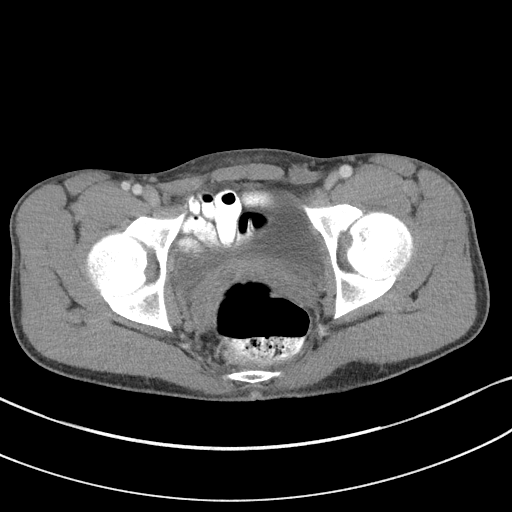
[im 29/90  soft-tissue]
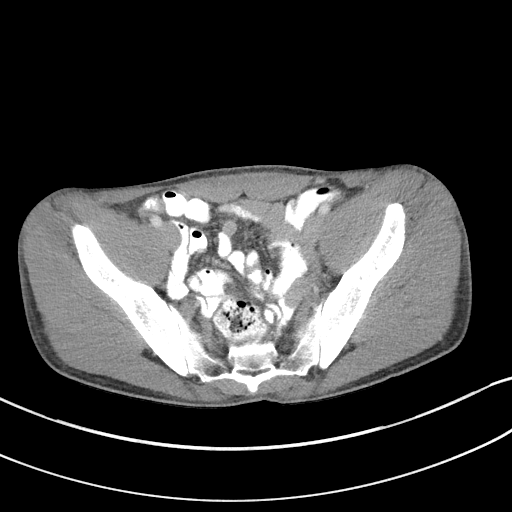
[im 33/90  soft-tissue]
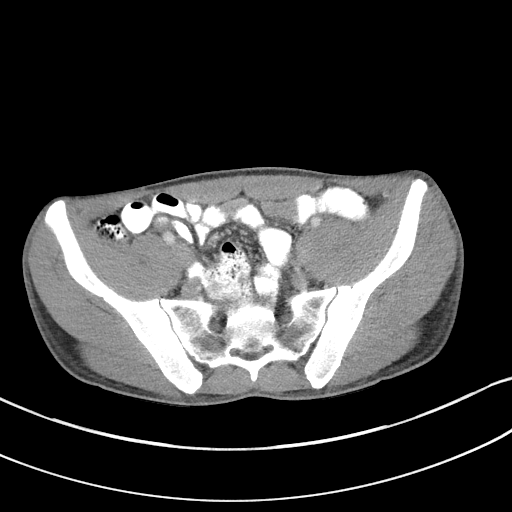
[im 43/90  soft-tissue]
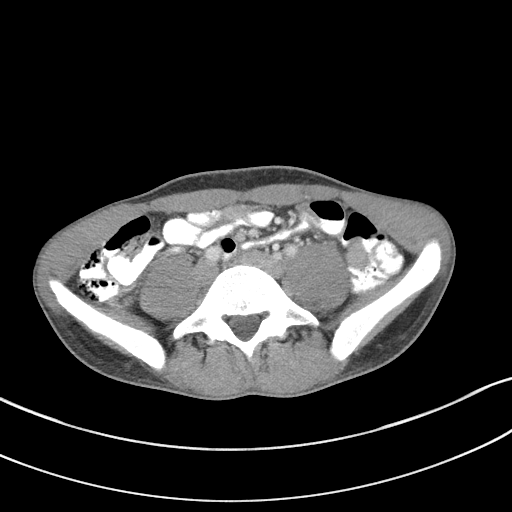
[im 47/90  soft-tissue]
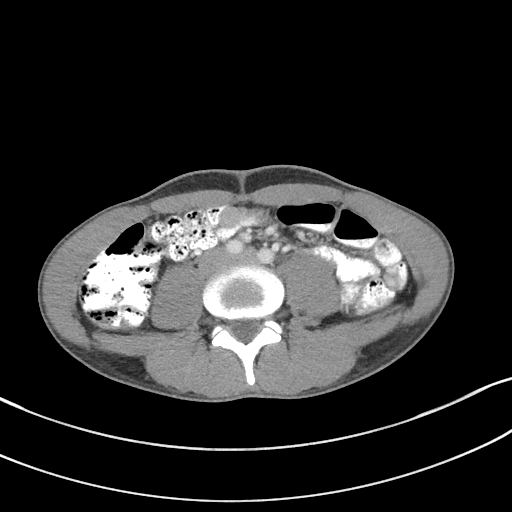
[im 57/90  soft-tissue]
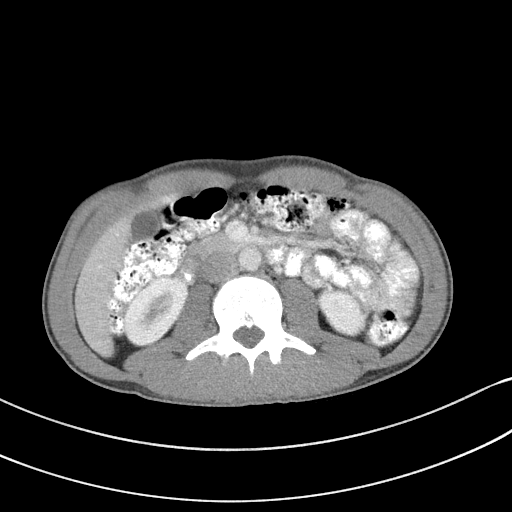
[im 61/90  soft-tissue]
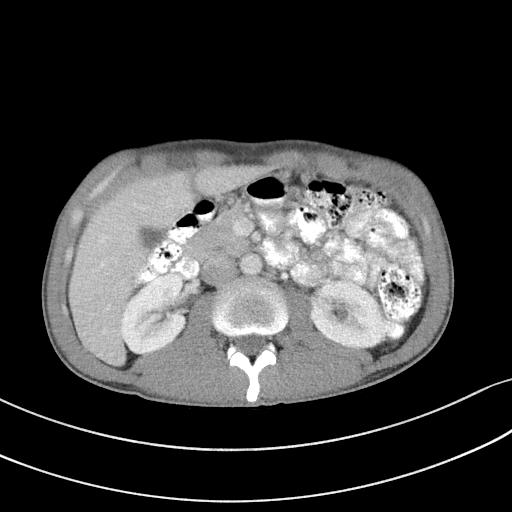
[im 61/90  bone]
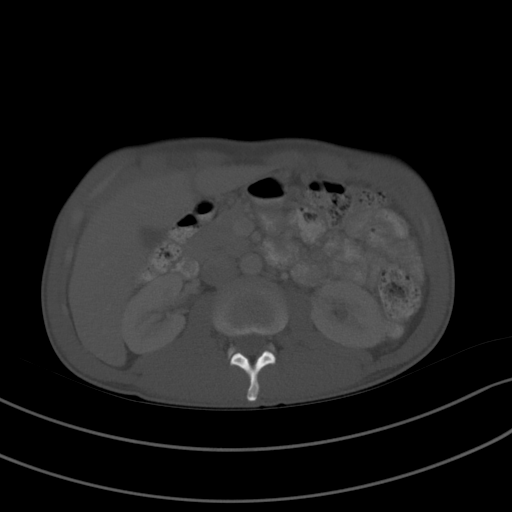
[im 71/90  soft-tissue]
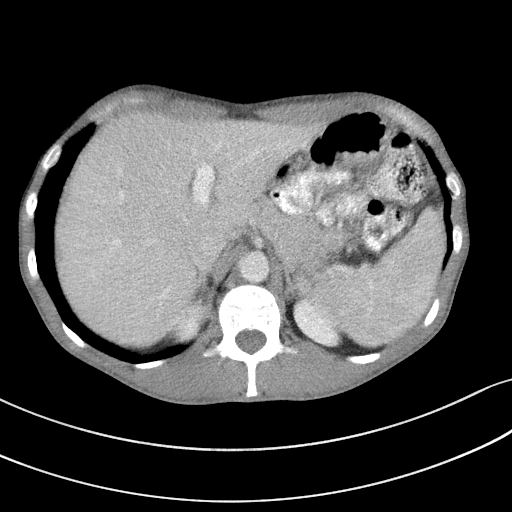
[im 75/90  soft-tissue]
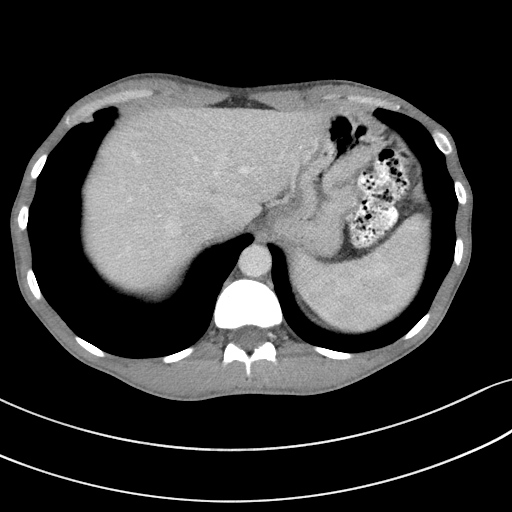
[im 85/90  soft-tissue]
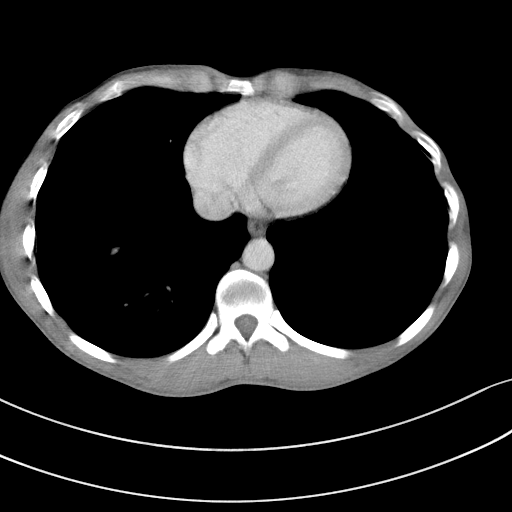

[Series 5: coronal st · coronal · 0.59mm/px · 3 of 70 slices shown]
[im 24/70  soft-tissue]
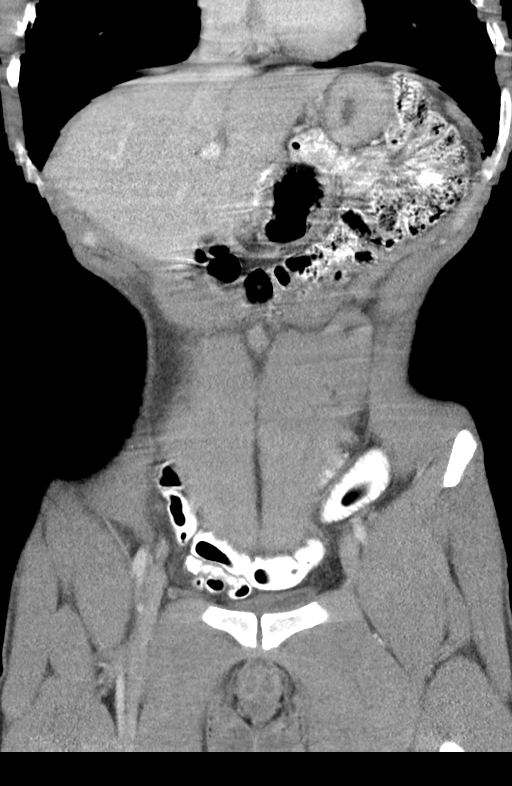
[im 31/70  soft-tissue]
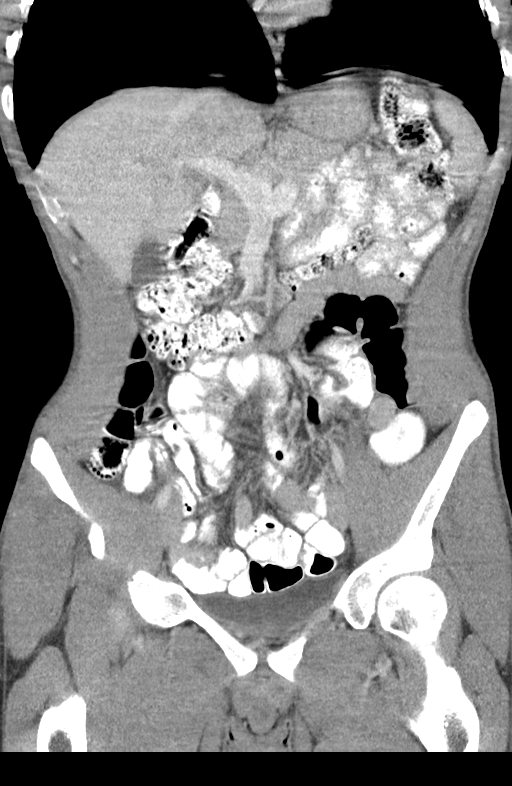
[im 39/70  soft-tissue]
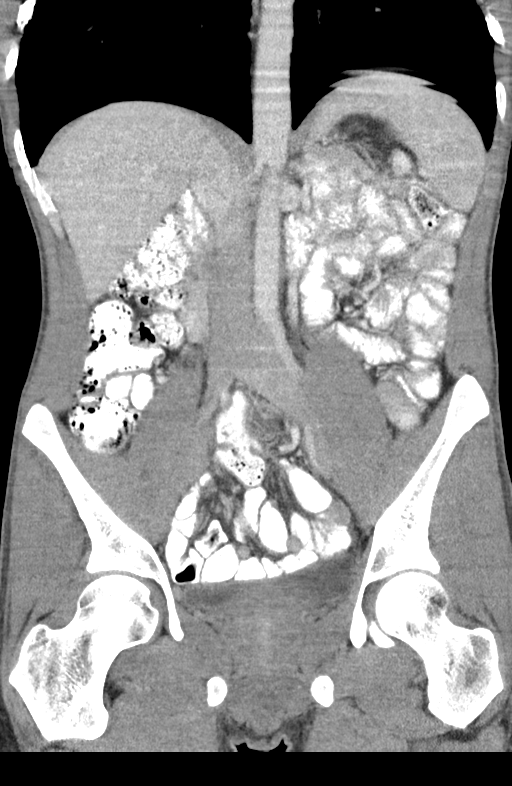

[15 of 46 positions shown; findings below may reference images not displayed]

RADIATION DOSE REDUCTION: This exam was performed according to the
departmental dose-optimization program which includes automated
exposure control, adjustment of the mA and/or kV according to
patient size and/or use of iterative reconstruction technique.

CONTRAST:  80mL OMNIPAQUE IOHEXOL 300 MG/ML  SOLN
FINDINGS: Study is degraded by respiratory motion.

Lower chest: No acute abnormality.

Hepatobiliary: No suspicious hepatic lesion. Gallbladder is
unremarkable. No biliary ductal dilation.

Pancreas: No pancreatic ductal dilation or evidence of acute
inflammation.

Spleen: Within normal limits.

Adrenals/Urinary Tract: Bilateral adrenal glands appear normal. No
hydronephrosis. Symmetric enhancement of bilateral kidneys. Urinary
bladder is unremarkable for degree of distension.

Stomach/Bowel: Radiopaque enteric contrast material traverses the
rectum. Stomach is unremarkable for degree of distension. No
pathologic dilation of small or large bowel. Terminal ileum appears
normal. The appendix is not confidently identified however there is
no pericecal inflammation. Moderate volume of formed stool
throughout the colon suggestive of constipation. No evidence of
acute bowel inflammation.

Vascular/Lymphatic: No significant vascular findings are present. No
enlarged abdominal or pelvic lymph nodes.

Reproductive: Prostate is unremarkable.

Other: No abdominal wall hernia or abnormality. No abdominopelvic
ascites.

Musculoskeletal: No acute or significant osseous findings.
IMPRESSION: 1. No acute abdominopelvic findings on this study degraded by
motion.
2. Moderate volume of formed stool throughout the colon suggestive
of constipation.

## 2022-12-16 NOTE — Telephone Encounter (Signed)
Sent MyChart message reminding him of 12/17/22 appt, time and location with Dr. Allena Katz And [place].

## 2022-12-17 ENCOUNTER — Ambulatory Visit (INDEPENDENT_AMBULATORY_CARE_PROVIDER_SITE_OTHER): Payer: Commercial Managed Care - HMO | Admitting: Audiology

## 2022-12-17 ENCOUNTER — Encounter (INDEPENDENT_AMBULATORY_CARE_PROVIDER_SITE_OTHER): Payer: Self-pay

## 2022-12-17 ENCOUNTER — Ambulatory Visit (INDEPENDENT_AMBULATORY_CARE_PROVIDER_SITE_OTHER): Payer: Commercial Managed Care - HMO | Admitting: Otolaryngology

## 2022-12-17 VITALS — Ht 67.0 in | Wt 152.0 lb

## 2022-12-17 DIAGNOSIS — H90A11 Conductive hearing loss, unilateral, right ear with restricted hearing on the contralateral side: Secondary | ICD-10-CM

## 2022-12-17 DIAGNOSIS — H9192 Unspecified hearing loss, left ear: Secondary | ICD-10-CM

## 2022-12-17 DIAGNOSIS — H9012 Conductive hearing loss, unilateral, left ear, with unrestricted hearing on the contralateral side: Secondary | ICD-10-CM | POA: Diagnosis not present

## 2022-12-17 DIAGNOSIS — H9011 Conductive hearing loss, unilateral, right ear, with unrestricted hearing on the contralateral side: Secondary | ICD-10-CM

## 2022-12-17 DIAGNOSIS — H7291 Unspecified perforation of tympanic membrane, right ear: Secondary | ICD-10-CM | POA: Diagnosis not present

## 2022-12-17 MED ORDER — CIPROFLOXACIN HCL 0.3 % OP SOLN: 4.0000 [drp] | Freq: Two times a day (BID) | OPHTHALMIC | 1 refills | Status: AC

## 2022-12-17 NOTE — Progress Notes (Signed)
  [address] [place], [place] [address] [phone]  Audiological Evaluation    Name: Eric Mcbride     DOB:   06-01-1990      MRN:   [id number]                                                                                     Service Date: 12/17/2022        Patient was referred today for a hearing evaluation by Dr. Allena Katz.   Symptoms Yes Details  Hearing loss  [x]  Patient reported perceiving right ear hearing loss.  Tinnitus  []  Patient denied experiencing tinnitus.  Previous ear surgeries  []  Patient denied any previous ear surgeries.  Family history  []  Patient denied family history of hearing loss.  Amplification  []  Patient denied the use of hearing aids.    Tympanogram: Right ear: Large external ear canal volume with no middle ear pressure and tympanic membrane compliance (Type B). Left ear: Normal external ear canal volume with normal middle ear pressure and tympanic membrane compliance (Type A).    Hearing Evaluation: The audiogram was completed using conventional audiometric techniques under headphones with good reliability.   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 Left ear: Normal hearing sensitivity from [phone] sloping to mild hearing loss at 8000 Hz.  Speech Audiometry: Right ear- Speech Reception Threshold (SRT) was obtained at 35 dBHL masked. Left ear- Speech Reception Threshold (SRT) was obtained at 15 dBHL.   Word Recognition Score Tested using NU-6 (MLV) Right ear: 96% was obtained at a presentation level of 75 dBHL with contralateral masking which is deemed as excellent understanding. Left ear: 100% was obtained at a presentation level of 55 dBHL which is deemed as excellent understanding.     Impression:  There is not a significant difference between word recognition scores between ears.   Recommendations: Repeat audiogram when changes are perceived or per  MD.   Eric Mcbride, AUD, CCC-A 12/17/22
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
## 2023-01-19 NOTE — Progress Notes (Signed)
 BH MD/PA/NP OP Progress Note  01/20/2023 12:55 PM Eric Mcbride  MRN:  [id number]  Visit Diagnosis:    ICD-10-CM   1. GAD (generalized anxiety disorder)  F41.1 propranolol  (INDERAL ) 10 MG tablet    hydrOXYzine  (ATARAX ) 10 MG tablet    escitalopram  (LEXAPRO ) 5 MG tablet    2. Mild episode of recurrent major depressive disorder (HCC)  F33.0 propranolol  (INDERAL ) 10 MG tablet    hydrOXYzine  (ATARAX ) 10 MG tablet    escitalopram  (LEXAPRO ) 5 MG tablet      Assessment: Eric Mcbride is a 33 y.o. male with a history of anxiety who presented to [facility] for initial evaluation on 10/28/21.    During initial evaluation patient reported symptoms of depression and anxiety including low mood, anhedonia, amotivation, decreased energy, poor concentration, constant worry, social anxiety, and physical symptoms with anxiety including palpitations, shortness of breath, diaphoresis, and nausea.  He denied any suicidal ideation or thoughts of self-harm along with any history of mania, psychosis, paranoia, or delusions.  Psychosocially patient is an immigrant from [place] and moved here with his brother 2 years ago.  His wife and son are still in [place] and have been unable to come here despite patient wanting them to.  He has a partial understanding of English which has led to him being more isolated.  At this time patient meets criteria for MDD and generalized anxiety disorder he also has a number of traits consistent with social anxiety disorder.  Eric Mcbride presents for follow-up evaluation. Today, 01/20/23, patient reports improvement in his anxiety and depression in the interim.  This is occurring despite patient discontinuing the prescribed medications.  He had reported adverse side effects of head pressure and head spinning from the medications.  He was unable to identify whether 1 medication particular was the cause.  Patient discontinued  medications after around a week and restarted the escitalopram  and hydroxyzine .  He is taking these medications on average once a week.  We reviewed how he escitalopram  is not affective when taking and this format.  That said since anxiety and depressive symptoms have been improving medication may not be needed.  Recommended discontinuing it.  He had endorsed chronic feelings of head pressure and feeling overwhelmed when engaging in social situations such as talking in person or on the phone.  Suggested a retrial of propranolol  at a lower dose.  Also with that being the only added medication will better be able to identify whether patient's prior reported adverse effects related to that or the Prozac  he had also started the same time.  The actual cause of the head pressure he experiences from the social interaction is unclear though social anxiety is one possibility.  Plan: - Restart Atarax  25 mg QHS prn for anxiety - Start Propranolol  5 mg BID prn for anxiety - Discontinue Lexapro  5 mg every day due to sexual side effects - Discontinue Vilazodone  10 mg daily - Discontinue gabapentin  100 mg BID prn for anxiety - CMP, CBC, and glucose reviewed - Crisis resources discussed - Refer for a neurologist - Follow up in a month  Chief Complaint:  Chief Complaint  Patient presents with   Follow-up   HPI: Interview was conducted with the assistance of a Teaching laboratory technician which patient was in agreement with.   Eric Mcbride forts that the last medications he took for one week, felt like his head was spinning and hurting which lasted the whole day. As he was not feeling well  he stopped the medication. He felt that after he stopped them his symptoms got better after the discontinuation.  Reports that he was taking the vilazodone  morning and the gabapentin  at night.  He attributes the effects to both medications though did not notice a particular spike after 1 in particular.  We discussed how the listed side  effects are not particularly common with either that said it would likely be attributed more to one than the other.  As he had been taking both at the same time and the symptoms lasted the whole day he had difficulty identifying if either medication was more likely to be the cause.  After discontinuing those 2 medications patient did restart the hydroxyzine  and the citalopram.  He is taking both around once a week.  Take the hydroxyzine  when he has trouble sleeping in which case the sedation is no longer an issue.  As for the citalopram patient will take it when he is having a period of increased anxiety.  He notes that despite not taking medications consistently his overall anxiety and depression are improving.  He is not feeling as amotivated, anhedonia, or fatigue as he had in the past.  He also gets the episodes of increased anxiety where his thoughts are racing and he ruminates less frequently.  When either of these do occur he has easier time moving past it compared to the past.  Patient only takes the citalopram on days where he has some increased anxiety.  Things that can trigger it are world events or if there is certain about his family back in [place].  Eric Mcbride does still endorse the symptoms of having his head feel heavy when he talks in person, on the phone, or watches TV.  This occurs after around 5 minutes at which point he has difficulty processing what is being said or engaging further.  Due to this feeling he often avoids social interaction.  This has been an ongoing issue that began back when patient was in [place].  He denies ever having a period where it was improved.  We discussed the symptoms and some of the potential causes including an underlying anxiety symptom.  Despite the overall general anxiety improving there might still be a situational component related to social interactions.  That being the case we suggested a retrial propranolol .  Patient was open to this after  revealing that while he had tried and failed it in the past he had only been on the medication for a few days before discontinuing.  We also will start at a lower dose than previously.  In regards to the remainder of his other medications that is appropriate to continue his hydroxyzine  as needed for anxiety/insomnia.  As for the escitalopram  we explained that taking it once a week is unlikely to lead to any benefit for anxiety.  That being the case we would recommend discontinuing it.  Patient is open to this though wanted to have some in reserve in case he felt the anxiety symptoms getting worse in the future.  Of note patient has been employed for the past 6 to 7 months.  That said he denies any significant financial concerns.  Past Psychiatric History: Patient had former psychiatric care in [place] for anxiety and depression and was prescribed fluoxetine .  He was trialed on Remeron  by his primary care provider with little benefit.  Patient denies ever being involved in therapy, he devise any suicide attempts, and he denies any prior psychiatric hospitalizations.  Patient was  started on Prozac  and propranolol  however discontinued both in 7 and 3 days respectively due to symptoms of dizziness, nausea, and vomiting. He endorsed similar     Patient denies any substance use.  Past Medical History:  Past Medical History:  Diagnosis Date   Acid reflux disease    Constipation    Dyspepsia    H. pylori infection     Past Surgical History:  Procedure Laterality Date   NO PAST SURGERIES      Family History:  Family History  Family history unknown: Yes    Social History:  Social History   Socioeconomic History   Marital status: Married    Spouse name: Not on file   Number of children: 1   Years of education: Not on file   Highest education level: Not on file  Occupational History   Occupation: [employer]  Tobacco Use   Smoking status: Never   Smokeless tobacco: Never  Vaping  Use   Vaping status: Never Used  Substance and Sexual Activity   Alcohol use: Not Currently   Drug use: Not Currently   Sexual activity: Yes    Birth control/protection: None  Other Topics Concern   Not on file  Social History Narrative   Not on file   Social Drivers of Health   Financial Resource Strain: Not on file  Food Insecurity: Patient Declined (08/14/2022)   [facility]   Hunger Vital Sign    Worried About Running Out of Food in the Last Year: Patient declined    Ran Out of Food in the Last Year: Patient declined  Transportation Needs: Patient Declined (08/14/2022)   [facility]   PRAPARE - Transportation    In the past 12 months, has lack of transportation kept you from medical appointments or from getting medications?: Patient declined    In the past 12 months, has lack of transportation kept you from meetings, work, or from getting things needed for daily living?: Patient declined  Physical Activity: Not on file  Stress: Not on file  Social Connections: Not on file    Allergies:  Allergies  Allergen Reactions   Augmentin  [Amoxicillin -Pot Clavulanate] Other (See Comments)    Patient unsure    Current Medications: Current Outpatient Medications  Medication Sig Dispense Refill   escitalopram  (LEXAPRO ) 5 MG tablet Take 1 tablet (5 mg total) by mouth daily. 30 tablet 1   propranolol  (INDERAL ) 10 MG tablet Take 0.5 tablets (5 mg total) by mouth 2 (two) times daily as needed (anxiety). 30 tablet 2   albuterol  (VENTOLIN  HFA) 108 (90 Base) MCG/ACT inhaler Inhale 2 puffs into the lungs every 6 (six) hours as needed for wheezing or shortness of breath (Cough). 54 g 1   budesonide -formoterol  (SYMBICORT ) 160-4.5 MCG/ACT inhaler Inhale 2 puffs into the lungs every 12 (twelve) hours. 3 each 1   cetirizine  (ZYRTEC  ALLERGY) 10 MG tablet Take 1 tablet (10 mg total) by mouth at bedtime. 90 tablet 1    fluticasone  (FLONASE ) 50 MCG/ACT nasal spray Place 1 spray into both nostrils daily. 47.4 mL 1   hydrOXYzine  (ATARAX ) 10 MG tablet Take 1 tablet (10 mg total) by mouth 3 (three) times daily as needed for anxiety. 30 tablet 1   methylPREDNISolone  (MEDROL  DOSEPAK) 4 MG TBPK tablet Take 24 mg on day 1, 20 mg on day 2, 16 mg on day 3, 12 mg on day 4, 8 mg on  day 5, 4 mg on day 6.  Take all tablets in each row at once, do not spread tablets out throughout the day. (Patient not taking: Reported on 12/17/2022) 21 tablet 0   montelukast  (SINGULAIR ) 10 MG tablet Take 1 tablet (10 mg total) by mouth at bedtime. 90 tablet 1   No current facility-administered medications for this visit.     Musculoskeletal: Strength & Muscle Tone: within normal limits Gait & Station: normal Patient leans: N/A  Psychiatric Specialty Exam: Review of Systems  Blood pressure 135/84, pulse 76, height 5' 8 (1.727 m), weight 150 lb (68 kg).Body mass index is 22.81 kg/m.  General Appearance: Fairly Groomed  Eye Contact:  Good  Speech:  Clear and Coherent and Normal Rate  Volume:  Normal  Mood:   Euthymic though has had intermittent mild anxiety  Affect:  Congruent  Thought Process:  Coherent  Orientation:  Full (Time, Place, and Person)  Thought Content: Logical   Suicidal Thoughts:  No  Homicidal Thoughts:  No  Memory:  Immediate;   Fair  Judgement:  Fair  Insight:  Fair  Psychomotor Activity:  Normal  Concentration:  Concentration: Fair  Recall:  [employer] of Knowledge: Fair  Language:  good, speaks pashto, and minimal english  Akathisia:  NA    AIMS (if indicated): not done  Assets:  Manufacturing Systems Engineer Desire for Improvement Housing Transportation  ADL's:  Intact  Cognition: WNL  Sleep:  Good   Metabolic Disorder Labs: No results found for: HGBA1C, MPG No results found for: PROLACTIN No results found for: CHOL, TRIG, HDL, CHOLHDL, VLDL, LDLCALC Lab Results  Component Value  Date   TSH 1.130 10/01/2020    Therapeutic Level Labs: No results found for: LITHIUM No results found for: VALPROATE No results found for: CBMZ   Screenings: GAD-7    Flowsheet Row [facility] Office Visit from 10/01/2020 in [facility]  Total GAD-7 Score 7 0      PHQ2-9    Flowsheet Row Office Visit from 11/03/2022 in [facility] - A Dept Of [place] Office Visit from 10/28/2021 in [facility] Office Visit from 05[facility]29/2021 in [facility] - A Dept Of [place] Office Visit from 01/04/2021 in [facility] - A Dept Of [facility] Office Visit from 12/10/2020 in [facility] - A Dept Of [facility]  PHQ-2 Total Score 0 3 2 0 0  PHQ-9 Total Score -- 16 -- 0 0      Flowsheet Row ED from 10/20/2022 in [facility] ED from 10/05/2022 in [facility] ED from 01/20/2022 in [facility] at [facility] RISK CATEGORY No Risk No Risk No Risk       Collaboration of Care: Collaboration of Care: Medication Management AEB medication prescription and Other provider involved in patient's care AEB audiology chart review  Patient/Guardian was advised Release of Information must be obtained prior to any record release in order to collaborate their care with an outside provider. Patient/Guardian was advised if they have not already done so to contact the registration department to sign all necessary forms in order for us  to release information regarding their care.   Consent: Patient/Guardian gives verbal consent for treatment and  assignment of benefits for services provided during this visit. Patient/Guardian expressed understanding and agreed to proceed.   40  minutes were spent in chart review, interview, psycho education, counseling, medical decision making, coordination of care and long-term prognosis.  Patient was given opportunity to ask question and all concerns and questions were addressed and answers. Excluding separately billable services.    Eric CHRISTELLA Finder, MD 01/20/2023, 12:55 PM

## 2023-01-20 ENCOUNTER — Ambulatory Visit (HOSPITAL_BASED_OUTPATIENT_CLINIC_OR_DEPARTMENT_OTHER): Payer: No Typology Code available for payment source | Admitting: Psychiatry

## 2023-01-20 ENCOUNTER — Telehealth: Payer: Self-pay | Admitting: Student

## 2023-01-20 ENCOUNTER — Other Ambulatory Visit: Payer: Self-pay

## 2023-01-20 ENCOUNTER — Encounter (HOSPITAL_COMMUNITY): Payer: Self-pay | Admitting: Psychiatry

## 2023-01-20 VITALS — BP 135/84 | HR 76 | Ht 68.0 in | Wt 150.0 lb

## 2023-01-20 DIAGNOSIS — F330 Major depressive disorder, recurrent, mild: Secondary | ICD-10-CM | POA: Diagnosis not present

## 2023-01-20 DIAGNOSIS — F411 Generalized anxiety disorder: Secondary | ICD-10-CM

## 2023-01-20 MED ORDER — HYDROXYZINE HCL 10 MG PO TABS: 10.0000 mg | ORAL_TABLET | Freq: Three times a day (TID) | ORAL | 1 refills | Status: DC | PRN

## 2023-01-20 MED ORDER — ESCITALOPRAM OXALATE 5 MG PO TABS: 5.0000 mg | ORAL_TABLET | Freq: Every day | ORAL | 1 refills | Status: DC

## 2023-01-20 MED ORDER — PROPRANOLOL HCL 10 MG PO TABS: 5.0000 mg | ORAL_TABLET | Freq: Two times a day (BID) | ORAL | 2 refills | Status: AC | PRN

## 2023-01-20 NOTE — Telephone Encounter (Signed)
 albuterol  (VENTOLIN  HFA) 108 (90 Base) MCG/ACT inhaler (Expired)  budesonide -formoterol  (SYMBICORT ) 160-4.5 MCG/ACT inhaler   montelukast  (SINGULAIR ) 10 MG tablet  [id number] - [place], [place] - [address]

## 2023-01-27 ENCOUNTER — Encounter: Payer: Self-pay | Admitting: *Deleted

## 2023-01-27 NOTE — Congregational Nurse Program (Signed)
  Dept: [phone]   Congregational Nurse Program Note  Date of Encounter: 01/27/2023  Past Medical History: Past Medical History:  Diagnosis Date   Acid reflux disease    Constipation    Dyspepsia    H. pylori infection     Encounter Details:  Community Questionnaire - 01/27/23 1317       Questionnaire   Ask client: Do you give verbal consent for me to treat you today? Yes    Student Assistance N/A    Location Patient Served  Oncologist    Encounter Setting Phone/Text/Email    Insurance Private or [place] Insurance    Insurance/Financial Assistance Referral N/A    Medication N/A    Medical Provider Yes    Screening Referrals Made N/A    Medical Referrals Made N/A    Medical Appointment Completed N/A    CNP Interventions Advocate/Support;Navigate Healthcare System    Screenings CN Performed N/A    ED Visit Averted Yes    Life-Saving Intervention Made N/A            Client texted this CN to request assistance with prescription refills.  I called the internal medicine center and left a message.  Client needs refills on Flonase and singulair prescriptions.  I will follow up with client once I hear back from clinic.  Roderic Palau, RN, MSN, CNP [phone] Office [phone] Cell

## 2023-01-29 ENCOUNTER — Telehealth: Payer: Self-pay

## 2023-01-29 ENCOUNTER — Telehealth: Payer: Self-pay | Admitting: *Deleted

## 2023-01-29 MED ORDER — BUDESONIDE-FORMOTEROL FUMARATE 160-4.5 MCG/ACT IN AERO: 2.0000 | INHALATION_SPRAY | Freq: Two times a day (BID) | RESPIRATORY_TRACT | 1 refills | Status: DC

## 2023-01-29 MED ORDER — MONTELUKAST SODIUM 10 MG PO TABS: 10.0000 mg | ORAL_TABLET | Freq: Every day | ORAL | 1 refills | Status: DC

## 2023-01-29 MED ORDER — ALBUTEROL SULFATE HFA 108 (90 BASE) MCG/ACT IN AERS: 2.0000 | INHALATION_SPRAY | Freq: Four times a day (QID) | RESPIRATORY_TRACT | 1 refills | Status: DC | PRN

## 2023-01-29 NOTE — Telephone Encounter (Signed)
Tinnie Gens, Engineer, water, was here - stated pt  he needs a refill on Fluticasone-salmeterol . This med is not on current med list - it was discontinued 10/20/22 (in the ER). Pt was scheduled an appt 1/31 to discuss.  Albuterol and Symbicort were refilled.

## 2023-01-29 NOTE — Telephone Encounter (Signed)
Pharmacy requesting a drug change for Eric Mcbride, per pharmacy the drug is not covered by the patients plan. The preferred alternative is symbicort,advair HFA,Advair Diskus.

## 2023-01-31 ENCOUNTER — Encounter (HOSPITAL_COMMUNITY): Payer: Self-pay

## 2023-01-31 ENCOUNTER — Ambulatory Visit (HOSPITAL_COMMUNITY)
Admission: EM | Admit: 2023-01-31 | Discharge: 2023-01-31 | Disposition: A | Discharge status: Home / Self Care | Payer: No Typology Code available for payment source | Attending: Physician Assistant | Admitting: Physician Assistant

## 2023-01-31 DIAGNOSIS — J4541 Moderate persistent asthma with (acute) exacerbation: Secondary | ICD-10-CM | POA: Diagnosis not present

## 2023-01-31 MED ORDER — PREDNISONE 10 MG PO TABS: 20.0000 mg | ORAL_TABLET | Freq: Two times a day (BID) | ORAL | 0 refills | Status: AC

## 2023-01-31 MED ORDER — IPRATROPIUM-ALBUTEROL 0.5-2.5 (3) MG/3ML IN SOLN
3.0000 mL | Freq: Once | RESPIRATORY_TRACT | Status: AC
  Administered 2023-01-31: 3 mL via RESPIRATORY_TRACT

## 2023-01-31 MED ORDER — METHYLPREDNISOLONE SODIUM SUCC 125 MG IJ SOLR
80.0000 mg | Freq: Once | INTRAMUSCULAR | Status: AC
  Administered 2023-01-31: 80 mg via INTRAMUSCULAR

## 2023-01-31 MED ORDER — ALBUTEROL SULFATE HFA 108 (90 BASE) MCG/ACT IN AERS: 2.0000 | INHALATION_SPRAY | RESPIRATORY_TRACT | 0 refills | Status: AC | PRN

## 2023-01-31 MED ORDER — ALBUTEROL SULFATE HFA 108 (90 BASE) MCG/ACT IN AERS: 2.0000 | INHALATION_SPRAY | Freq: Four times a day (QID) | RESPIRATORY_TRACT | 1 refills | Status: AC | PRN

## 2023-01-31 MED ORDER — MONTELUKAST SODIUM 10 MG PO TABS: 10.0000 mg | ORAL_TABLET | Freq: Every day | ORAL | 0 refills | Status: DC

## 2023-01-31 MED ORDER — FLUTICASONE-SALMETEROL 100-50 MCG/ACT IN AEPB: 1.0000 | INHALATION_SPRAY | Freq: Two times a day (BID) | RESPIRATORY_TRACT | 0 refills | Status: DC

## 2023-01-31 MED ORDER — IPRATROPIUM-ALBUTEROL 0.5-2.5 (3) MG/3ML IN SOLN
RESPIRATORY_TRACT | Status: AC
Start: 2023-01-31 — End: ?
  Filled 2023-01-31: qty 3

## 2023-01-31 MED ORDER — METHYLPREDNISOLONE SODIUM SUCC 125 MG IJ SOLR
INTRAMUSCULAR | Status: AC
  Filled 2023-01-31: qty 2

## 2023-01-31 NOTE — ED Triage Notes (Signed)
Patient states she is not taking any of the medication prescribed for him.

## 2023-01-31 NOTE — ED Triage Notes (Addendum)
Patient presents with chest congestion x 3 days. Patient states he is having a hard time coughing up his mucous. At this time patient denies any SOB or chest pain.

## 2023-01-31 NOTE — ED Provider Notes (Signed)
[facility]   MRN: [id number] DOB: [birth year]  Subjective:   Eric Mcbride is a 33 y.o. male presenting for productive cough, chest congestion, wheezing increased for the last 3 days.  Patient has a history of asthma and is unclear whether he has been using his medications as directed or not.  He shows me a picture of Wixela inhaler.  He also shows me a bottle of montelukast 10 mg, and states that this is ineffective.  He states that he did not use any breathing treatments or take any medication today.  The weather has been very cold.  He denies any chest pain or shortness of breath at this time.  He denies any fever or chills.  No headache.  No other symptoms at this time.  Due to language barrier, a virtual interpreter was present during the history-taking and subsequent discussion (and for part of the physical exam) with this patient.   No current facility-administered medications for this encounter.  Current Outpatient Medications:    albuterol (VENTOLIN HFA) 108 (90 Base) MCG/ACT inhaler, Inhale 2 puffs into the lungs every 4 (four) hours as needed for wheezing or shortness of breath., Disp: 18 g, Rfl: 0   fluticasone-salmeterol (WIXELA INHUB) 100-50 MCG/ACT AEPB, Inhale 1 puff into the lungs 2 (two) times daily. Rinse mouth after use., Disp: 60 each, Rfl: 0   [START ON 02/01/2023] predniSONE (DELTASONE) 10 MG tablet, Take 2 tablets (20 mg total) by mouth 2 (two) times daily with a meal for 5 days., Disp: 20 tablet, Rfl: 0   albuterol (VENTOLIN HFA) 108 (90 Base) MCG/ACT inhaler, Inhale 2 puffs into the lungs every 6 (six) hours as needed for wheezing or shortness of breath (Cough)., Disp: 18 g, Rfl: 1   budesonide-formoterol (SYMBICORT) 160-4.5 MCG/ACT inhaler, Inhale 2 puffs into the lungs every 12 (twelve) hours., Disp: 3 each, Rfl: 1   cetirizine (ZYRTEC ALLERGY) 10 MG tablet, Take 1 tablet (10 mg total) by mouth at bedtime., Disp: 90 tablet, Rfl: 1   escitalopram  (LEXAPRO) 5 MG tablet, Take 1 tablet (5 mg total) by mouth daily., Disp: 30 tablet, Rfl: 1   fluticasone (FLONASE) 50 MCG/ACT nasal spray, Place 1 spray into both nostrils daily., Disp: 47.4 mL, Rfl: 1   hydrOXYzine (ATARAX) 10 MG tablet, Take 1 tablet (10 mg total) by mouth 3 (three) times daily as needed for anxiety., Disp: 30 tablet, Rfl: 1   montelukast (SINGULAIR) 10 MG tablet, Take 1 tablet (10 mg total) by mouth at bedtime., Disp: 90 tablet, Rfl: 0   propranolol (INDERAL) 10 MG tablet, Take 0.5 tablets (5 mg total) by mouth 2 (two) times daily as needed (anxiety)., Disp: 30 tablet, Rfl: 2   Allergies  Allergen Reactions   Augmentin [Amoxicillin-Pot Clavulanate] Other (See Comments)    Patient unsure    Past Medical History:  Diagnosis Date   Acid reflux disease    Constipation    Dyspepsia    H. pylori infection      Past Surgical History:  Procedure Laterality Date   NO PAST SURGERIES      Family History  Family history unknown: Yes    Social History   Tobacco Use   Smoking status: Never   Smokeless tobacco: Never  Vaping Use   Vaping status: Never Used  Substance Use Topics   Alcohol use: Not Currently   Drug use: Not Currently    ROS REFER TO HPI FOR PERTINENT POSITIVES AND NEGATIVES  Objective:   Vitals: BP 129/88 (BP Location: Left Arm)   Pulse 72   Temp 98.3 F (36.8 C) (Oral)   Resp 18   SpO2 98%   Physical Exam Vitals and nursing note reviewed.  Constitutional:      General: He is not in acute distress.    Appearance: Normal appearance. He is not ill-appearing.  HENT:     Head: Normocephalic.     Right Ear: Tympanic membrane, ear canal and external ear normal.     Left Ear: Tympanic membrane, ear canal and external ear normal.     Nose: No congestion.     Mouth/Throat:     Mouth: Mucous membranes are moist.     Pharynx: No oropharyngeal exudate or posterior oropharyngeal erythema.  Eyes:     Extraocular Movements: Extraocular  movements intact.     Conjunctiva/sclera: Conjunctivae normal.     Pupils: Pupils are equal, round, and reactive to light.  Cardiovascular:     Rate and Rhythm: Normal rate and regular rhythm.     Pulses: Normal pulses.     Heart sounds: Normal heart sounds. No murmur heard. Pulmonary:     Effort: Pulmonary effort is normal. No respiratory distress.     Breath sounds: Wheezing and rhonchi present.  Musculoskeletal:     Cervical back: Normal range of motion.  Skin:    General: Skin is warm.  Neurological:     Mental Status: He is alert and oriented to person, place, and time.  Psychiatric:        Mood and Affect: Mood normal.        Behavior: Behavior normal.     No results found for this or any previous visit (from the past 24 hours).  Assessment and Plan :   PDMP not reviewed this encounter.  1. Moderate persistent asthma with acute exacerbation   33 year old male presenting with acute asthma exacerbation.  Unclear to what extent he is compliant with his medications or not, there is a language barrier even despite use of the interpreter.  His lung sounds were quite wheezy and diminished upon arrival.  His SpO2 was 92%.  He was given a DuoNeb treatment and an injection of Depo-Medrol while in the urgent care.  Upon recheck, his lung sounds were greatly improved and he was feeling much better.  His SpO2 was 98%.  Felt comfortable for discharge.  He remained stable during his entire stay.  I did refill his Wixela, Ventolin albuterol inhaler, and Singulair 10 mg.  I also gave a prescription for prednisone to start on tomorrow to help with this acute asthma exacerbation.  Offered to refer to pulmonology, but he declined and said he would follow-up with his primary care.  Strict ER precautions discussed.  Also discussed importance of compliance with medications.  Patient seemed to be understanding and agreeable.    AllwardtCrist Infante, PA-C 01/31/23 1409

## 2023-01-31 NOTE — Discharge Instructions (Signed)
You have an acute asthma exacerbation going on. You will need to use your albuterol (Ventolin) rescue inhaler every 4-6 hours for the next few days until you are feeling better. You may start the prednisone tablets tomorrow morning, which will also help with this current asthma flare. You will need to continue Wixela twice daily long-term to prevent you from experiencing any exacerbations in the future. You need to continue Singulair (montelukast) 10 mg daily to help maintain asthma symptoms as well. You need to follow-up with your primary care this coming week.  If suddenly worse in breathing or chest symptoms, present to the ER.

## 2023-02-06 ENCOUNTER — Encounter: Payer: Self-pay | Admitting: Student

## 2023-02-06 ENCOUNTER — Ambulatory Visit (INDEPENDENT_AMBULATORY_CARE_PROVIDER_SITE_OTHER): Payer: No Typology Code available for payment source | Admitting: Student

## 2023-02-06 VITALS — BP 127/78 | HR 70 | Temp 98.0°F | Ht 66.0 in | Wt 152.2 lb

## 2023-02-06 DIAGNOSIS — Z603 Acculturation difficulty: Secondary | ICD-10-CM | POA: Insufficient documentation

## 2023-02-06 DIAGNOSIS — J4520 Mild intermittent asthma, uncomplicated: Secondary | ICD-10-CM | POA: Diagnosis not present

## 2023-02-06 DIAGNOSIS — F330 Major depressive disorder, recurrent, mild: Secondary | ICD-10-CM | POA: Diagnosis not present

## 2023-02-06 DIAGNOSIS — K219 Gastro-esophageal reflux disease without esophagitis: Secondary | ICD-10-CM

## 2023-02-06 DIAGNOSIS — F411 Generalized anxiety disorder: Secondary | ICD-10-CM

## 2023-02-06 DIAGNOSIS — Z758 Other problems related to medical facilities and other health care: Secondary | ICD-10-CM

## 2023-02-06 MED ORDER — FLUTICASONE-SALMETEROL 100-50 MCG/ACT IN AEPB: 1.0000 | INHALATION_SPRAY | Freq: Two times a day (BID) | RESPIRATORY_TRACT | 11 refills | Status: DC

## 2023-02-06 MED ORDER — MONTELUKAST SODIUM 10 MG PO TABS: 10.0000 mg | ORAL_TABLET | Freq: Every day | ORAL | 3 refills | Status: DC

## 2023-02-06 MED ORDER — FLUTICASONE-SALMETEROL 100-50 MCG/ACT IN AEPB: 1.0000 | INHALATION_SPRAY | Freq: Every day | RESPIRATORY_TRACT | 2 refills | Status: DC | PRN

## 2023-02-06 MED ORDER — FAMOTIDINE 40 MG PO TABS: 40.0000 mg | ORAL_TABLET | Freq: Every day | ORAL | 5 refills | Status: DC

## 2023-02-06 MED ORDER — ESCITALOPRAM OXALATE 5 MG PO TABS: 5.0000 mg | ORAL_TABLET | Freq: Every day | ORAL | 11 refills | Status: DC

## 2023-02-06 NOTE — Assessment & Plan Note (Signed)
Patient continues to follow with behavioral health.  No acute concerns today.  Plan: -Continue hydroxyzine 10 mg 3 times daily as needed -Continue Lexapro 5 mg daily -Continue propranolol 5 mg twice daily -Patient to continue to follow with behavioral health

## 2023-02-06 NOTE — Patient Instructions (Addendum)
Eric Mcbride,Thank you for allowing me to take part in your care today.  Here are your instructions.  1.  Please take your medications as prescribed.  You can take Advair twice a day.  I have sent you a new inhaler for this.  I have prescribed you another inhaler for Advair for rescue.  The rescue inhaler is only used for rescue.  Take Advair twice a day no matter what.  2.  I have written for famotidine, this will help with acid reflux.  Take this daily, about 30 minutes before eating.  3.  I have referred you for lung function test.  They will call you to schedule.  4.  Please continue taking all of your other medications as prescribed.  Remember Lexapro is a daily medication.  Do not take this as needed.  5.  Come back in 3 months.  Thank you, Dr. Allena Katz  If you have any other questions please contact the internal medicine clinic at [phone] If it is after hours, please call the [facility] at [phone] and then ask the person who picks up for the resident on call.

## 2023-02-06 NOTE — Assessment & Plan Note (Signed)
Patient native language is Pashto.  There is been difficult communication with patient and he is having trouble with knowing what medications he is on 1 to take them in for what.  Counseled patient today on medications.  List provided.  Plan: -Medications reviewed with patient today -List provided

## 2023-02-06 NOTE — Progress Notes (Signed)
CC: Asthma follow-up  HPI:  Eric Mcbride is a 33 y.o. male with a past medical history of asthma, GERD, depression who presents for med refill.  Please see assessment and plan for full HPI.  Medications: Asthma: Albuterol inhaler, Advair 1 puff twice daily Allergies: Zyrtec 10 mg nightly, Flonase, Singulair 10 mg nightly Depression/anxiety: Lexapro 5 mg daily, hydroxyzine 10 mg 3 times daily as needed, propranolol 5 mg twice daily as needed  Seen in urgent care and diagnosed with asthma exacerbation on 01/31/2023.  Was given DuoNebs and Solu-Medrol shot.  Patient discharged home with prednisone.  Most recent labs: 01/04/2021: CMP: Glucose 79, creatinine 0.89, sodium 141, potassium 4.2, normal liver enzymes CBC: Hemoglobin 16.3, platelets 241, white blood cell count 6.0.  Past Medical History:  Diagnosis Date   Acid reflux disease    Constipation    Dyspepsia    H. pylori infection      Current Outpatient Medications:    famotidine (PEPCID) 40 MG tablet, Take 1 tablet (40 mg total) by mouth daily., Disp: 30 tablet, Rfl: 5   fluticasone-salmeterol (ADVAIR DISKUS) 100-50 MCG/ACT AEPB, Inhale 1 puff into the lungs daily as needed (Shortness of breath)., Disp: 60 each, Rfl: 2   albuterol (VENTOLIN HFA) 108 (90 Base) MCG/ACT inhaler, Inhale 2 puffs into the lungs every 4 (four) hours as needed for wheezing or shortness of breath., Disp: 18 g, Rfl: 0   albuterol (VENTOLIN HFA) 108 (90 Base) MCG/ACT inhaler, Inhale 2 puffs into the lungs every 6 (six) hours as needed for wheezing or shortness of breath (Cough)., Disp: 18 g, Rfl: 1   cetirizine (ZYRTEC ALLERGY) 10 MG tablet, Take 1 tablet (10 mg total) by mouth at bedtime., Disp: 90 tablet, Rfl: 1   escitalopram (LEXAPRO) 5 MG tablet, Take 1 tablet (5 mg total) by mouth daily., Disp: 30 tablet, Rfl: 11   fluticasone (FLONASE) 50 MCG/ACT nasal spray, Place 1 spray into both nostrils daily., Disp: 47.4 mL, Rfl: 1    fluticasone-salmeterol (ADVAIR) 100-50 MCG/ACT AEPB, Inhale 1 puff into the lungs 2 (two) times daily., Disp: 60 each, Rfl: 11   hydrOXYzine (ATARAX) 10 MG tablet, Take 1 tablet (10 mg total) by mouth 3 (three) times daily as needed for anxiety., Disp: 30 tablet, Rfl: 1   montelukast (SINGULAIR) 10 MG tablet, Take 1 tablet (10 mg total) by mouth at bedtime., Disp: 90 tablet, Rfl: 3   predniSONE (DELTASONE) 10 MG tablet, Take 2 tablets (20 mg total) by mouth 2 (two) times daily with a meal for 5 days., Disp: 20 tablet, Rfl: 0   propranolol (INDERAL) 10 MG tablet, Take 0.5 tablets (5 mg total) by mouth 2 (two) times daily as needed (anxiety)., Disp: 30 tablet, Rfl: 2  Review of Systems:    Negative except for what is stated in HPI  Physical Exam:  Vitals:   02/06/23 0916  BP: 127/78  Pulse: 70  Temp: 98 F (36.7 C)  TempSrc: Oral  SpO2: 98%  Weight: 152 lb 3.2 oz (69 kg)  Height: 5\' 6"  (1.676 m)    General: Patient is sitting comfortably in the room  Cardio: Regular rate and rhythm, no murmurs, rubs or gallops Pulmonary: Clear to ausculation bilaterally with no rales, rhonchi, and crackles    Assessment & Plan:   Asthma Patient has a past medical history of mild intermittent asthma.  He states he recently had an asthma exacerbation on 01/31/2023.  He states he was treated with prednisone.  He  states he is feeling better today. Patient has had multiple inhalers in the past.  He has been trialed on Wixela, Symbicort and Advair.  He states that his symptoms are not frequent.  He reports his last asthma exacerbation was because he ran out of his inhaler.  He does not have any nighttime awakenings.  He does not have any coughs.  Patient has never had PFTs.  Plan: -PFTs -Refill Advair for prevention -Add on another Advair prescription for rescue  Gastroesophageal reflux disease without esophagitis Patient has a past medical history of GERD.  In the past he has had endoscopy without  evidence of esophagitis.  He has been trialed on omeprazole in the past but stopped taking this medication because it makes him feel lightheaded.  He reports having burning sensations at times.  Will trial H2 blocker.    Plan: -Start famotidine 40 mg daily  MDD (major depressive disorder) No acute concerns today.  Plan: -Continue Lexapro 5 mg daily -Continue hydroxyzine 10 mg 3 times daily as needed -Continue propranolol 5 mg twice daily as needed  GAD (generalized anxiety disorder) Patient continues to follow with behavioral health.  No acute concerns today.  Plan: -Continue hydroxyzine 10 mg 3 times daily as needed -Continue Lexapro 5 mg daily -Continue propranolol 5 mg twice daily -Patient to continue to follow with behavioral health  Language barrier affecting health care Patient native language is Pashto.  There is been difficult communication with patient and he is having trouble with knowing what medications he is on 1 to take them in for what.  Counseled patient today on medications.  List provided.  Plan: -Medications reviewed with patient today -List provided  Patient discussed with Dr.  Mercie Eon  Modena Slater, DO PGY-2 Internal Medicine Resident  Pager: [phone]

## 2023-02-06 NOTE — Assessment & Plan Note (Signed)
Patient has a past medical history of GERD.  In the past he has had endoscopy without evidence of esophagitis.  He has been trialed on omeprazole in the past but stopped taking this medication because it makes him feel lightheaded.  He reports having burning sensations at times.  Will trial H2 blocker.    Plan: -Start famotidine 40 mg daily

## 2023-02-06 NOTE — Assessment & Plan Note (Signed)
Patient has a past medical history of mild intermittent asthma.  He states he recently had an asthma exacerbation on 01/31/2023.  He states he was treated with prednisone.  He states he is feeling better today. Patient has had multiple inhalers in the past.  He has been trialed on Wixela, Symbicort and Advair.  He states that his symptoms are not frequent.  He reports his last asthma exacerbation was because he ran out of his inhaler.  He does not have any nighttime awakenings.  He does not have any coughs.  Patient has never had PFTs.  Plan: -PFTs -Refill Advair for prevention -Add on another Advair prescription for rescue

## 2023-02-06 NOTE — Assessment & Plan Note (Signed)
No acute concerns today.  Plan: -Continue Lexapro 5 mg daily -Continue hydroxyzine 10 mg 3 times daily as needed -Continue propranolol 5 mg twice daily as needed

## 2023-02-09 NOTE — Progress Notes (Signed)
 Internal Medicine Clinic Attending  Case discussed with the resident at the time of the visit.  We reviewed the resident's history and exam and pertinent patient test results.  I agree with the assessment, diagnosis, and plan of care documented in the resident's note.

## 2023-02-23 ENCOUNTER — Telehealth: Payer: Self-pay

## 2023-02-23 NOTE — Telephone Encounter (Signed)
Prior Authorization for patient (Fluticasone-Salmeterol 100-50MCG/ACT aerosol powder) came through on cover my meds was submitted with last office notes awaiting approval or denial.  KEY:[id number]

## 2023-02-23 NOTE — Telephone Encounter (Signed)
Eric Mcbride (Key: [id number]) PA Case ID #: [id number] Rx #: [id number] Need Help? Call us at [phone] Outcome Approved today by [facility] Medicaid 2017 Approved. This drug is covered on the Preferred Drug List. It does not require prior approval. Please call the pharmacy to process the claim. Authorization Expiration Date: 01/06/2024 Drug Fluticasone-Salmeterol 100-50MCG/ACT aerosol powder ePA cloud logo Form [facility] Medicaid of [employer] Prior Authorization Request Form [phone] NCPDP) Original Claim Info 75 Submit 3 DS For Emerg Fill with PA Type01, PA Number 1111, Level of Service 3*WAG*Non-Form Product- Potential Alternatives are: [id number] - ADVAIR DISKUS, [id number] - ADVAIR HFA, [id number] - SYM

## 2023-03-08 ENCOUNTER — Encounter: Payer: Self-pay | Admitting: *Deleted

## 2023-03-08 NOTE — Congregational Nurse Program (Signed)
  Dept: [phone]   Congregational Nurse Program Note  Date of Encounter: 03/08/2023  Past Medical History: Past Medical History:  Diagnosis Date   Acid reflux disease    Constipation    Dyspepsia    H. pylori infection     Encounter Details:  Community Questionnaire - 03/08/23 1838       Questionnaire   Ask client: Do you give verbal consent for me to treat you today? Yes    Student Assistance N/A    Location [facility]    Encounter Setting Phone/Text/Email    Population Status Migrant/Refugee    Insurance Medicaid    Insurance/Financial Assistance Referral N/A    Medication N/A    Medical Provider Yes    Screening Referrals Made N/A    Medical Referrals Made N/A    Medical Appointment Completed N/A    CNP Interventions Advocate/Support;Navigate Healthcare System    Screenings CN Performed N/A    ED Visit Averted Yes    Life-Saving Intervention Made N/A            Client reached out to this CN asking for address for his [facility] appointment on 3/4.  Texted client with address and confirmed his appointment for 03/10/23 at 11 am.  Client will follow up with this CN as needed.    Roderic Palau, RN, MSN, CNP [phone] Office [phone] Cell

## 2023-03-09 ENCOUNTER — Other Ambulatory Visit: Payer: Self-pay | Admitting: Student

## 2023-03-09 ENCOUNTER — Telehealth: Payer: Self-pay

## 2023-03-09 MED ORDER — BUDESONIDE-FORMOTEROL FUMARATE 160-4.5 MCG/ACT IN AERO: 2.0000 | INHALATION_SPRAY | RESPIRATORY_TRACT | 12 refills | Status: DC | PRN

## 2023-03-09 NOTE — Telephone Encounter (Addendum)
 Drug change request for fluticasone/salm disk, per pharmacy the preferred alternatives are advaid sickus,advair hfa,symbicort

## 2023-03-09 NOTE — Progress Notes (Deleted)
 BH MD/PA/NP OP Progress Note  03/09/2023 9:20 AM Eric Mcbride  MRN:  [id number]  Visit Diagnosis:  No diagnosis found.   Assessment: Eric Mcbride is a 33 y.o. male with a history of anxiety who presented to [facility] at [facility] for initial evaluation on 10/28/21.    During initial evaluation patient reported symptoms of depression and anxiety including low mood, anhedonia, amotivation, decreased energy, poor concentration, constant worry, social anxiety, and physical symptoms with anxiety including palpitations, shortness of breath, diaphoresis, and nausea.  He denied any suicidal ideation or thoughts of self-harm along with any history of mania, psychosis, paranoia, or delusions.  Psychosocially patient is an immigrant from [place] and moved here with his brother 2 years ago.  His wife and son are still in [place] and have been unable to come here despite patient wanting them to.  He has a partial understanding of English which has led to him being more isolated.  At this time patient meets criteria for MDD and generalized anxiety disorder he also has a number of traits consistent with social anxiety disorder.  Eric Mcbride presents for follow-up evaluation. Today, 03/09/23, patient reports     improvement in his anxiety and depression in the interim.  This is occurring despite patient discontinuing the prescribed medications.  He had reported adverse side effects of head pressure and head spinning from the medications.  He was unable to identify whether 1 medication particular was the cause.  Patient discontinued medications after around a week and restarted the escitalopram and hydroxyzine.  He is taking these medications on average once a week.  We reviewed how he escitalopram is not affective when taking and this format.  That said since anxiety and depressive symptoms have been improving medication may not be needed.  Recommended discontinuing  it.  He had endorsed chronic feelings of head pressure and feeling overwhelmed when engaging in social situations such as talking in person or on the phone.  Suggested a retrial of propranolol at a lower dose.  Also with that being the only added medication will better be able to identify whether patient's prior reported adverse effects related to that or the Prozac he had also started the same time.  The actual cause of the head pressure he experiences from the social interaction is unclear though social anxiety is one possibility.  Plan: - Restart Atarax 25 mg QHS prn for anxiety - Start Propranolol 5 mg BID prn for anxiety - Discontinue Lexapro 5 mg every day due to sexual side effects - Discontinue Vilazodone 10 mg daily - Discontinue gabapentin 100 mg BID prn for anxiety - CMP, CBC, and glucose reviewed - Crisis resources discussed - Refer for a neurologist - Follow up in a month  Chief Complaint:  No chief complaint on file.  HPI: Interview was conducted with the assistance of a Teaching laboratory technician which patient was in agreement with.   Eric Mcbride reports that    the last medications he took for one week, felt like his head was spinning and hurting which lasted the whole day. As he was not feeling well he stopped the medication. He felt that after he stopped them his symptoms got better after the discontinuation.  Reports that he was taking the vilazodone morning and the gabapentin at night.  He attributes the effects to both medications though did not notice a particular spike after 1 in particular.  We discussed how the listed side effects are not particularly common with either that  said it would likely be attributed more to one than the other.  As he had been taking both at the same time and the symptoms lasted the whole day he had difficulty identifying if either medication was more likely to be the cause.  After discontinuing those 2 medications patient did restart the hydroxyzine  and the citalopram.  He is taking both around once a week.  Take the hydroxyzine when he has trouble sleeping in which case the sedation is no longer an issue.  As for the citalopram patient will take it when he is having a period of increased anxiety.  He notes that despite not taking medications consistently his overall anxiety and depression are improving.  He is not feeling as amotivated, anhedonia, or fatigue as he had in the past.  He also gets the episodes of increased anxiety where his thoughts are racing and he ruminates less frequently.  When either of these do occur he has easier time moving past it compared to the past.  Patient only takes the citalopram on days where he has some increased anxiety.  Things that can trigger it are world events or if there is certain about his family back in [place].  [name] does still endorse the symptoms of having his head feel heavy when he talks in person, on the phone, or watches TV.  This occurs after around 5 minutes at which point he has difficulty processing what is being said or engaging further.  Due to this feeling he often avoids social interaction.  This has been an ongoing issue that began back when patient was in [place].  He denies ever having a period where it was improved.  We discussed the symptoms and some of the potential causes including an underlying anxiety symptom.  Despite the overall general anxiety improving there might still be a situational component related to social interactions.  That being the case we suggested a retrial propranolol.  Patient was open to this after revealing that while he had tried and failed it in the past he had only been on the medication for a few days before discontinuing.  We also will start at a lower dose than previously.  In regards to the remainder of his other medications that is appropriate to continue his hydroxyzine as needed for anxiety/insomnia.  As for the escitalopram we explained that  taking it once a week is unlikely to lead to any benefit for anxiety.  That being the case we would recommend discontinuing it.  Patient is open to this though wanted to have some in reserve in case he felt the anxiety symptoms getting worse in the future.  Of note patient has been employed for the past 6 to 7 months.  That said he denies any significant financial concerns.  Past Psychiatric History: Patient had former psychiatric care in [place] for anxiety and depression and was prescribed fluoxetine.  He was trialed on Remeron by his primary care provider with little benefit.  Patient denies ever being involved in therapy, he devise any suicide attempts, and he denies any prior psychiatric hospitalizations.  Patient was started on Prozac and propranolol however discontinued both in 7 and 3 days respectively due to symptoms of dizziness, nausea, and vomiting. He endorsed similar     Patient denies any substance use.  Past Medical History:  Past Medical History:  Diagnosis Date   Acid reflux disease    Constipation    Dyspepsia    H. pylori infection  Past Surgical History:  Procedure Laterality Date   NO PAST SURGERIES      Family History:  Family History  Family history unknown: Yes    Social History:  Social History   Socioeconomic History   Marital status: Married    Spouse name: Not on file   Number of children: 1   Years of education: Not on file   Highest education level: Not on file  Occupational History   Occupation: [employer]  Tobacco Use   Smoking status: Never   Smokeless tobacco: Never  Vaping Use   Vaping status: Never Used  Substance and Sexual Activity   Alcohol use: Not Currently   Drug use: Not Currently   Sexual activity: Yes    Birth control/protection: None  Other Topics Concern   Not on file  Social History Narrative   Not on file   Social Drivers of Health   Financial Resource Strain: Not on file  Food Insecurity: Patient  Declined (08/14/2022)   Received from [facility]   Hunger Vital Sign    Worried About Running Out of Food in the Last Year: Patient declined    Ran Out of Food in the Last Year: Patient declined  Transportation Needs: Patient Declined (08/14/2022)   Received from [facility] and [facility]   PRAPARE - Transportation    In the past 12 months, has lack of transportation kept you from medical appointments or from getting medications?: Patient declined    In the past 12 months, has lack of transportation kept you from meetings, work, or from getting things needed for daily living?: Patient declined  Physical Activity: Not on file  Stress: Not on file  Social Connections: Not on file    Allergies:  Allergies  Allergen Reactions   Augmentin [Amoxicillin-Pot Clavulanate] Other (See Comments)    Patient unsure    Current Medications: Current Outpatient Medications  Medication Sig Dispense Refill   albuterol (VENTOLIN HFA) 108 (90 Base) MCG/ACT inhaler Inhale 2 puffs into the lungs every 4 (four) hours as needed for wheezing or shortness of breath. 18 g 0   albuterol (VENTOLIN HFA) 108 (90 Base) MCG/ACT inhaler Inhale 2 puffs into the lungs every 6 (six) hours as needed for wheezing or shortness of breath (Cough). 18 g 1   cetirizine (ZYRTEC ALLERGY) 10 MG tablet Take 1 tablet (10 mg total) by mouth at bedtime. 90 tablet 1   escitalopram (LEXAPRO) 5 MG tablet Take 1 tablet (5 mg total) by mouth daily. 30 tablet 11   famotidine (PEPCID) 40 MG tablet Take 1 tablet (40 mg total) by mouth daily. 30 tablet 5   fluticasone (FLONASE) 50 MCG/ACT nasal spray Place 1 spray into both nostrils daily. 47.4 mL 1   fluticasone-salmeterol (ADVAIR DISKUS) 100-50 MCG/ACT AEPB Inhale 1 puff into the lungs daily as needed (Shortness of breath). 60 each 2   fluticasone-salmeterol (ADVAIR) 100-50 MCG/ACT AEPB Inhale 1 puff into the lungs 2 (two) times daily. 60 each 11    hydrOXYzine (ATARAX) 10 MG tablet Take 1 tablet (10 mg total) by mouth 3 (three) times daily as needed for anxiety. 30 tablet 1   montelukast (SINGULAIR) 10 MG tablet Take 1 tablet (10 mg total) by mouth at bedtime. 90 tablet 3   propranolol (INDERAL) 10 MG tablet Take 0.5 tablets (5 mg total) by mouth 2 (two) times daily as needed (anxiety). 30 tablet 2   No current facility-administered medications for this  visit.     Musculoskeletal: Strength & Muscle Tone: within normal limits Gait & Station: normal Patient leans: N/A  Psychiatric Specialty Exam: Review of Systems  There were no vitals taken for this visit.There is no height or weight on file to calculate BMI.  General Appearance: Fairly Groomed  Eye Contact:  Good  Speech:  Clear and Coherent and Normal Rate  Volume:  Normal  Mood:   Euthymic though has had intermittent mild anxiety  Affect:  Congruent  Thought Process:  Coherent  Orientation:  Full (Time, Place, and Person)  Thought Content: Logical   Suicidal Thoughts:  No  Homicidal Thoughts:  No  Memory:  Immediate;   Fair  Judgement:  Fair  Insight:  Fair  Psychomotor Activity:  Normal  Concentration:  Concentration: Fair  Recall:  [employer] of Knowledge: Fair  Language:  good, speaks pashto, and minimal english  Akathisia:  NA    AIMS (if indicated): not done  Assets:  Manufacturing systems engineer Desire for Improvement Housing Transportation  ADL's:  Intact  Cognition: WNL  Sleep:  Good   Metabolic Disorder Labs: No results found for: "HGBA1C", "MPG" No results found for: "PROLACTIN" No results found for: "CHOL", "TRIG", "HDL", "CHOLHDL", "VLDL", "LDLCALC" Lab Results  Component Value Date   TSH 1.130 10/01/2020    Therapeutic Level Labs: No results found for: "LITHIUM" No results found for: "VALPROATE" No results found for: "CBMZ"   Screenings: GAD-7    Flowsheet Row [facility] Office Visit from 10/01/2020 in [facility]  Total GAD-7 Score 7 0      PHQ2-9    Flowsheet Row Office Visit from 11/03/2022 in [facility] - A Dept Of [place]. [facility] Office Visit from 10/28/2021 in [facility] Office Visit from 05/29/2021 in [facility] - A Dept Of [place]. [facility] Office Visit from 01/04/2021 in [facility] - A Dept Of Eligha [facility]ideg[facility] Office Visit from 12/10/2020 in [facility] - A Dept Of [facility]  PHQ-2 Total Score 0 3 2 0 0  PHQ-9 Total Score -- 16 -- 0 0      Flowsheet Row ED from 01/31/2023 in [facility] at [facility] ED from 10/20/2022 in [facility] at [facility] ED from 10/05/2022 in [facility] at [facility] RISK CATEGORY No Risk No Risk No Risk       Collaboration of Care: Collaboration of Care: Medication Management AEB medication prescription and Other provider involved in patient's care AEB PCP and ED chart review  Patient/Guardian was advised Release of Information must be obtained prior to any record release in order to collaborate their care with an outside provider. Patient/Guardian was advised if they have not already done so to contact the registration department to sign all necessary forms in order for [place] to release information regarding their care.   Consent: Patient/Guardian gives verbal consent for treatment and assignment of benefits for services provided during this visit. Patient/Guardian expressed understanding and agreed to proceed.   40 minutes were spent in chart review, interview, psycho education, counseling, medical decision making, coordination of care and long-term prognosis.  Patient was given opportunity to ask question and all concerns and questions were addressed and answers. Excluding separately  billable services.  Stasia Cavalier, MD 03/09/2023, 9:20 AM

## 2023-03-10 ENCOUNTER — Ambulatory Visit (HOSPITAL_COMMUNITY): Payer: No Typology Code available for payment source | Admitting: Psychiatry

## 2023-04-09 ENCOUNTER — Encounter (HOSPITAL_COMMUNITY): Payer: Self-pay | Admitting: Emergency Medicine

## 2023-04-09 ENCOUNTER — Ambulatory Visit (HOSPITAL_COMMUNITY)
Admission: EM | Admit: 2023-04-09 | Discharge: 2023-04-09 | Disposition: A | Discharge status: Home / Self Care | Attending: Internal Medicine | Admitting: Internal Medicine

## 2023-04-09 DIAGNOSIS — H66001 Acute suppurative otitis media without spontaneous rupture of ear drum, right ear: Secondary | ICD-10-CM

## 2023-04-09 DIAGNOSIS — H60392 Other infective otitis externa, left ear: Secondary | ICD-10-CM | POA: Diagnosis not present

## 2023-04-09 DIAGNOSIS — H1033 Unspecified acute conjunctivitis, bilateral: Secondary | ICD-10-CM

## 2023-04-09 MED ORDER — CIPROFLOXACIN-DEXAMETHASONE 0.3-0.1 % OT SUSP: 4.0000 [drp] | Freq: Two times a day (BID) | OTIC | 0 refills | Status: AC

## 2023-04-09 MED ORDER — AZITHROMYCIN 250 MG PO TABS: 250.0000 mg | ORAL_TABLET | Freq: Every day | ORAL | 0 refills | Status: DC

## 2023-04-09 MED ORDER — OFLOXACIN 0.3 % OP SOLN: 1.0000 [drp] | Freq: Four times a day (QID) | OPHTHALMIC | 0 refills | Status: AC

## 2023-04-09 NOTE — ED Provider Notes (Signed)
 [facility]    CSN: [id number] Arrival date & time: 04/09/23  1831      History   Chief Complaint Chief Complaint  Patient presents with   Ear Drainage   Eye Pain    HPI Eric Mcbride is a 33 y.o. male.   33 year old male who presents urgent care with complaints of his ears feeling muffled bilaterally with left ear bleeding.  He reports that this started a few days ago.  He tried to clean out his ear with a Q-tip yesterday and caused the left ear to start bleeding.  Today he noted that his eyes were burning, irritated and draining.  They are also sensitive to light.  He denies any fevers, chills, congestion, sick contacts or other constitutional symptoms.  He has had similar symptoms back in February and used eyedrops at that time but they did not help much.  The eyedrops were over-the-counter and he tried using them again with this episode but only gets a very short period of relief.  He takes Singulair daily because of his asthma.  He denies any worsening of his asthma symptoms.       Ear Drainage Pertinent negatives include no chest pain, no abdominal pain and no shortness of breath.  Eye Pain Pertinent negatives include no chest pain, no abdominal pain and no shortness of breath.    Past Medical History:  Diagnosis Date   Acid reflux disease    Constipation    Dyspepsia    H. pylori infection     Patient Active Problem List   Diagnosis Date Noted   Language barrier affecting health care 02/06/2023   Functional dyspepsia 03/10/2022   Asthma 03/10/2022   GAD (generalized anxiety disorder) 10/28/2021   MDD (major depressive disorder) 10/28/2021   History of typhoid fever 05/29/2021   Loss of weight 01/21/2021   Gastroesophageal reflux disease without esophagitis 10/01/2020   History of Helicobacter pylori infection 10/01/2020    Past Surgical History:  Procedure Laterality Date   NO PAST SURGERIES         Home Medications    Prior to  Admission medications   Medication Sig Start Date End Date Taking? Authorizing Provider  albuterol (VENTOLIN HFA) 108 (90 Base) MCG/ACT inhaler Inhale 2 puffs into the lungs every 4 (four) hours as needed for wheezing or shortness of breath. 01/31/23   Allwardt, Alyssa M, PA-C  albuterol (VENTOLIN HFA) 108 (90 Base) MCG/ACT inhaler Inhale 2 puffs into the lungs every 6 (six) hours as needed for wheezing or shortness of breath (Cough). 01/31/23 03/02/23  Allwardt, Crist Infante, PA-C  budesonide-formoterol (SYMBICORT) 160-4.5 MCG/ACT inhaler Inhale 2 puffs into the lungs as needed. 03/09/23   Kathleen Lime, MD  cetirizine (ZYRTEC ALLERGY) 10 MG tablet Take 1 tablet (10 mg total) by mouth at bedtime. 10/20/22 04/18/23  Theadora Rama Scales, PA-C  escitalopram (LEXAPRO) 5 MG tablet Take 1 tablet (5 mg total) by mouth daily. 02/06/23 02/06/24  Modena Slater, DO  famotidine (PEPCID) 40 MG tablet Take 1 tablet (40 mg total) by mouth daily. 02/06/23 08/05/23  Modena Slater, DO  fluticasone (FLONASE) 50 MCG/ACT nasal spray Place 1 spray into both nostrils daily. 10/20/22   Theadora Rama Scales, PA-C  hydrOXYzine (ATARAX) 10 MG tablet Take 1 tablet (10 mg total) by mouth 3 (three) times daily as needed for anxiety. 01/20/23   Stasia Cavalier, MD  montelukast (SINGULAIR) 10 MG tablet Take 1 tablet (10 mg total) by mouth at bedtime. 02/06/23  05/07/23  Modena Slater, DO  propranolol (INDERAL) 10 MG tablet Take 0.5 tablets (5 mg total) by mouth 2 (two) times daily as needed (anxiety). 01/20/23   Stasia Cavalier, MD    Family History Family History  Family history unknown: Yes    Social History Social History   Tobacco Use   Smoking status: Never   Smokeless tobacco: Never  Vaping Use   Vaping status: Never Used  Substance Use Topics   Alcohol use: Not Currently   Drug use: Not Currently     Allergies   Augmentin [amoxicillin-pot clavulanate]   Review of Systems Review of Systems  Constitutional:  Negative for  chills and fever.  HENT:  Positive for ear pain and hearing loss. Negative for sore throat.   Eyes:  Positive for photophobia, pain, discharge and redness. Negative for visual disturbance.  Respiratory:  Negative for cough and shortness of breath.   Cardiovascular:  Negative for chest pain and palpitations.  Gastrointestinal:  Negative for abdominal pain and vomiting.  Genitourinary:  Negative for dysuria and hematuria.  Musculoskeletal:  Negative for arthralgias and back pain.  Skin:  Negative for color change and rash.  Neurological:  Negative for seizures and syncope.  All other systems reviewed and are negative.    Physical Exam Triage Vital Signs ED Triage Vitals  Encounter Vitals Group     BP 04/09/23 1903 127/84     Systolic BP Percentile --      Diastolic BP Percentile --      Pulse Rate 04/09/23 1903 65     Resp 04/09/23 1903 15     Temp 04/09/23 1903 98.1 F (36.7 C)     Temp Source 04/09/23 1903 Oral     SpO2 04/09/23 1903 97 %     Weight --      Height --      Head Circumference --      Peak Flow --      Pain Score 04/09/23 1859 7     Pain Loc --      Pain Education --      Exclude from Growth Chart --    No data found.  Updated Vital Signs BP 127/84 (BP Location: Left Arm)   Pulse 65   Temp 98.1 F (36.7 C) (Oral)   Resp 15   SpO2 97%   Visual Acuity Right Eye Distance:   Left Eye Distance:   Bilateral Distance:    Right Eye Near:   Left Eye Near:    Bilateral Near:     Physical Exam Vitals and nursing note reviewed.  Constitutional:      General: He is not in acute distress.    Appearance: He is well-developed.  HENT:     Head: Normocephalic and atraumatic.     Right Ear: Ear canal and external ear normal. A middle ear effusion is present. Tympanic membrane is injected.     Left Ear: Tympanic membrane and external ear normal. Laceration (In the ear canal) present.     Ears:     Comments: Moderate wax present in both ears but TMs are  visualized. Eyes:     General:        Right eye: Discharge present.        Left eye: Discharge present.    Extraocular Movements: Extraocular movements intact.     Conjunctiva/sclera:     Right eye: Right conjunctiva is injected. Exudate present.     Left eye: Left  conjunctiva is injected. Exudate present.     Pupils: Pupils are equal, round, and reactive to light.     Comments: Mild erythema and swelling in the eyelids bilateral  Cardiovascular:     Rate and Rhythm: Normal rate and regular rhythm.     Heart sounds: No murmur heard. Pulmonary:     Effort: Pulmonary effort is normal. No respiratory distress.     Breath sounds: Normal breath sounds.  Abdominal:     Palpations: Abdomen is soft.     Tenderness: There is no abdominal tenderness.  Musculoskeletal:        General: No swelling.     Cervical back: Neck supple.  Skin:    General: Skin is warm and dry.     Capillary Refill: Capillary refill takes less than 2 seconds.  Neurological:     Mental Status: He is alert.  Psychiatric:        Mood and Affect: Mood normal.     UC Treatments / Results  Labs (all labs ordered are listed, but only abnormal results are displayed) Labs Reviewed - No data to display  EKG   Radiology No results found.  Procedures Procedures (including critical care time)  Medications Ordered in UC Medications - No data to display  Initial Impression / Assessment and Plan / UC Course  I have reviewed the triage vital signs and the nursing notes.  Pertinent labs & imaging results that were available during my care of the patient were reviewed by me and considered in my medical decision making (see chart for details).     Acute bacterial conjunctivitis of both eyes  Other infective acute otitis externa of left ear   The left ear canal has a laceration present like even from using Q-tips to try and clean the ears out.  The ears are irrigated of remaining wax and the tympanic membrane on  the left is clear but the right has some redness and fluid behind the ear drum.  Symptoms of hearing loss may be secondary to allergies but if this does not improve then may need to follow-up with primary care physician.  The eyes bilateral are injected with mild swelling and redness of the eyelids consistent with conjunctivitis.  We will treat the conditions with the following: Right ear: Azithromycin 250mg  Take 2 tablets today and the 1 tablet daily for 4 more days.  Left ear: Ciprodex 4 drops in the left ear twice daily for 5 days.  Both eyes: Ofloxacin 1 drop in both eyes 4 times daily for 5 days May use Tylenol and ibuprofen alternating for pain Return to urgent care or PCP if symptoms worsen or fail to resolve.    Final Clinical Impressions(s) / UC Diagnoses   Final diagnoses:  None   Discharge Instructions   None    ED Prescriptions   None    PDMP not reviewed this encounter.   Quintella Reichert 04/09/23 2004

## 2023-04-09 NOTE — Discharge Instructions (Addendum)
 The left ear canal has a laceration present like even from using Q-tips to try and clean the ears out.  The ears are irrigated of remaining wax and the tympanic membrane on the left is clear but the right has some redness and fluid behind the ear drum.  Symptoms of hearing loss may be secondary to allergies but if this does not improve then may need to follow-up with primary care physician.  The eyes bilateral are injected with mild swelling and redness of the eyelids consistent with conjunctivitis.  We will treat the conditions with the following: Right ear: Azithromycin 250mg  Take 2 tablets today and the 1 tablet daily for 4 more days.  Left ear: Ciprodex 4 drops in the left ear twice daily for 5 days.  Both eyes: Ofloxacin 1 drop in both eyes 4 times daily for 5 days May use Tylenol and ibuprofen alternating for pain Return to urgent care or PCP if symptoms worsen or fail to resolve.

## 2023-04-09 NOTE — ED Triage Notes (Signed)
 Used interpretor  Yesterday left ear had some bleeding and heat. Reports during the night while driving eyes were burning.  Used allergy drops for eyes that only relieved for very short time.

## 2023-04-14 ENCOUNTER — Ambulatory Visit (HOSPITAL_COMMUNITY): Admitting: Psychiatry

## 2023-05-04 ENCOUNTER — Encounter: Payer: No Typology Code available for payment source | Admitting: Student

## 2023-05-18 ENCOUNTER — Ambulatory Visit: Payer: Self-pay

## 2023-05-18 NOTE — Telephone Encounter (Signed)
  Chief Complaint: eye and ear pain Symptoms: eye pain, strain, red, ear pain Frequency: constant Pertinent Negatives: Patient's nurse denies drainage from eyes and ears, fever Disposition: [] ED /[] Urgent Care (no appt availability in office) / [x] Appointment(In office/virtual)/ []  [facility]/ [] Home Care/ [] Refused Recommended Disposition /[] [facility]/ []  Follow-up with PCP Additional Notes:  [facility] care nurse Alayne Allis calling to schedule an appointment per patient request, patient needs interpreter.  Complaining of bilateral eye burning when driving and looking at lights, eye strain. Feels like it is allergy symptoms.  Also complaining of mild bilateral ear pain, no drainage. His request for appointment is to evaluate eye strain. Note he was treated for conjunctivitis and OM dx at [facility] 04/09/23. Acute visit scheduled 05/19/23. Nurse Marvene Slipper will provide [place] with appointment date/time and instruction for reasons to call back.   Copied from CRM [id number]. Topic: Clinical - Red Word Triage >> May 18, 2023  3:07 PM Julie Oddi wrote: Red Word that prompted transfer to Nurse Triage: Eye pain - burning/itching, ear pain/discomfort Reason for Disposition  Eye pain present > 24 hours  Protocols used: Eye Pain and Other Symptoms-A-AH

## 2023-05-18 NOTE — Progress Notes (Deleted)
 BH MD/PA/NP OP Progress Note  05/18/2023 8:59 AM Asir Lanoue  MRN:  [id number]  Visit Diagnosis:  No diagnosis found.   Assessment: Eric Mcbride is a 33 y.o. male with a history of anxiety who presented to [facility] at [facility] for initial evaluation on 10/28/21.    During initial evaluation patient reported symptoms of depression and anxiety including low mood, anhedonia, amotivation, decreased energy, poor concentration, constant worry, social anxiety, and physical symptoms with anxiety including palpitations, shortness of breath, diaphoresis, and nausea.  He denied any suicidal ideation or thoughts of self-harm along with any history of mania, psychosis, paranoia, or delusions.  Psychosocially patient is an immigrant from [place] and moved here with his brother 2 years ago.  His wife and son are still in [place] and have been unable to come here despite patient wanting them to.  He has a partial understanding of English which has led to him being more isolated.  At this time patient meets criteria for MDD and generalized anxiety disorder he also has a number of traits consistent with social anxiety disorder.  Naythan Freyre presents for follow-up evaluation. Today, 05/18/23, patient reports    improvement in his anxiety and depression in the interim.  This is occurring despite patient discontinuing the prescribed medications.  He had reported adverse side effects of head pressure and head spinning from the medications.  He was unable to identify whether 1 medication particular was the cause.  Patient discontinued medications after around a week and restarted the escitalopram  and hydroxyzine .  He is taking these medications on average once a week.  We reviewed how he escitalopram  is not affective when taking and this format.  That said since anxiety and depressive symptoms have been improving medication may not be needed.  Recommended discontinuing it.   He had endorsed chronic feelings of head pressure and feeling overwhelmed when engaging in social situations such as talking in person or on the phone.  Suggested a retrial of propranolol  at a lower dose.  Also with that being the only added medication will better be able to identify whether patient's prior reported adverse effects related to that or the Prozac  he had also started the same time.  The actual cause of the head pressure he experiences from the social interaction is unclear though social anxiety is one possibility.  Plan: - Restart Atarax  25 mg QHS prn for anxiety - Start Propranolol  5 mg BID prn for anxiety - Discontinue Lexapro  5 mg every day due to sexual side effects - Discontinue Vilazodone  10 mg daily - Discontinue gabapentin  100 mg BID prn for anxiety - CMP, CBC, and glucose reviewed - Crisis resources discussed - Refer for a neurologist - Follow up in a month  Chief Complaint:  No chief complaint on file.  HPI: Interview was conducted with the assistance of a Teaching laboratory technician which patient was in agreement with.   Dream presents today reporting that   forts that the last medications he took for one week, felt like his head was spinning and hurting which lasted the whole day. As he was not feeling well he stopped the medication. He felt that after he stopped them his symptoms got better after the discontinuation.  Reports that he was taking the vilazodone  morning and the gabapentin  at night.  He attributes the effects to both medications though did not notice a particular spike after 1 in particular.  We discussed how the listed side effects are not particularly common with  either that said it would likely be attributed more to one than the other.  As he had been taking both at the same time and the symptoms lasted the whole day he had difficulty identifying if either medication was more likely to be the cause.  After discontinuing those 2 medications patient did  restart the hydroxyzine  and the citalopram.  He is taking both around once a week.  Take the hydroxyzine  when he has trouble sleeping in which case the sedation is no longer an issue.  As for the citalopram patient will take it when he is having a period of increased anxiety.  He notes that despite not taking medications consistently his overall anxiety and depression are improving.  He is not feeling as amotivated, anhedonia, or fatigue as he had in the past.  He also gets the episodes of increased anxiety where his thoughts are racing and he ruminates less frequently.  When either of these do occur he has easier time moving past it compared to the past.  Patient only takes the citalopram on days where he has some increased anxiety.  Things that can trigger it are world events or if there is certain about his family back in [place].  Marvelous does still endorse the symptoms of having his head feel heavy when he talks in person, on the phone, or watches TV.  This occurs after around 5 minutes at which point he has difficulty processing what is being said or engaging further.  Due to this feeling he often avoids social interaction.  This has been an ongoing issue that began back when patient was in [place].  He denies ever having a period where it was improved.  We discussed the symptoms and some of the potential causes including an underlying anxiety symptom.  Despite the overall general anxiety improving there might still be a situational component related to social interactions.  That being the case we suggested a retrial propranolol .  Patient was open to this after revealing that while he had tried and failed it in the past he had only been on the medication for a few days before discontinuing.  We also will start at a lower dose than previously.  In regards to the remainder of his other medications that is appropriate to continue his hydroxyzine  as needed for anxiety/insomnia.  As for the  escitalopram  we explained that taking it once a week is unlikely to lead to any benefit for anxiety.  That being the case we would recommend discontinuing it.  Patient is open to this though wanted to have some in reserve in case he felt the anxiety symptoms getting worse in the future.  Of note patient has been employed for the past 6 to 7 months.  That said he denies any significant financial concerns.  Past Psychiatric History: Patient had former psychiatric care in [place] for anxiety and depression and was prescribed fluoxetine .  He was trialed on Remeron  by his primary care provider with little benefit.  Patient denies ever being involved in therapy, he devise any suicide attempts, and he denies any prior psychiatric hospitalizations.  Patient was started on Prozac  and propranolol  however discontinued both in 7 and 3 days respectively due to symptoms of dizziness, nausea, and vomiting. He endorsed similar     Patient denies any substance use.  Past Medical History:  Past Medical History:  Diagnosis Date   Acid reflux disease    Constipation    Dyspepsia    H. pylori infection  Past Surgical History:  Procedure Laterality Date   NO PAST SURGERIES      Family History:  Family History  Family history unknown: Yes    Social History:  Social History   Socioeconomic History   Marital status: Married    Spouse name: Not on file   Number of children: 1   Years of education: Not on file   Highest education level: Not on file  Occupational History   Occupation: [employer]  Tobacco Use   Smoking status: Never   Smokeless tobacco: Never  Vaping Use   Vaping status: Never Used  Substance and Sexual Activity   Alcohol use: Not Currently   Drug use: Not Currently   Sexual activity: Yes    Birth control/protection: None  Other Topics Concern   Not on file  Social History Narrative   Not on file   Social Drivers of Health   Financial Resource Strain: Not on file   Food Insecurity: Patient Declined (08/14/2022)   [facility]   Hunger Vital Sign    Worried About Running Out of Food in the Last Year: Patient declined    Ran Out of Food in the Last Year: Patient declined  Transportation Needs: Patient Declined (08/14/2022)   [facility]   PRAPARE - Transportation    In the past 12 months, has lack of transportation kept you from medical appointments or from getting medications?: Patient declined    In the past 12 months, has lack of transportation kept you from meetings, work, or from getting things needed for daily living?: Patient declined  Physical Activity: Not on file  Stress: Not on file  Social Connections: Not on file    Allergies:  Allergies  Allergen Reactions   Augmentin  [Amoxicillin -Pot Clavulanate] Other (See Comments)    Patient unsure    Current Medications: Current Outpatient Medications  Medication Sig Dispense Refill   albuterol  (VENTOLIN  HFA) 108 (90 Base) MCG/ACT inhaler Inhale 2 puffs into the lungs every 4 (four) hours as needed for wheezing or shortness of breath. 18 g 0   albuterol  (VENTOLIN  HFA) 108 (90 Base) MCG/ACT inhaler Inhale 2 puffs into the lungs every 6 (six) hours as needed for wheezing or shortness of breath (Cough). 18 g 1   azithromycin  (ZITHROMAX ) 250 MG tablet Take 1 tablet (250 mg total) by mouth daily. Take first 2 tablets together, then 1 every day until finished. 6 tablet 0   budesonide -formoterol  (SYMBICORT ) 160-4.5 MCG/ACT inhaler Inhale 2 puffs into the lungs as needed. 1 each 12   cetirizine  (ZYRTEC  ALLERGY) 10 MG tablet Take 1 tablet (10 mg total) by mouth at bedtime. 90 tablet 1   escitalopram  (LEXAPRO ) 5 MG tablet Take 1 tablet (5 mg total) by mouth daily. 30 tablet 11   famotidine  (PEPCID ) 40 MG tablet Take 1 tablet (40 mg total) by mouth daily. 30 tablet 5   fluticasone  (FLONASE ) 50 MCG/ACT nasal spray Place 1 spray  into both nostrils daily. 47.4 mL 1   hydrOXYzine  (ATARAX ) 10 MG tablet Take 1 tablet (10 mg total) by mouth 3 (three) times daily as needed for anxiety. 30 tablet 1   montelukast  (SINGULAIR ) 10 MG tablet Take 1 tablet (10 mg total) by mouth at bedtime. 90 tablet 3   propranolol  (INDERAL ) 10 MG tablet Take 0.5 tablets (5 mg total) by mouth 2 (two) times daily as needed (anxiety). 30 tablet 2   No  current facility-administered medications for this visit.     Musculoskeletal: Strength & Muscle Tone: within normal limits Gait & Station: normal Patient leans: N/A  Psychiatric Specialty Exam: Review of Systems  There were no vitals taken for this visit.There is no height or weight on file to calculate BMI.  General Appearance: Fairly Groomed  Eye Contact:  Good  Speech:  Clear and Coherent and Normal Rate  Volume:  Normal  Mood:  Euthymic though has had intermittent mild anxiety  Affect:  Congruent  Thought Process:  Coherent  Orientation:  Full (Time, Place, and Person)  Thought Content: Logical   Suicidal Thoughts:  No  Homicidal Thoughts:  No  Memory:  Immediate;   Fair  Judgement:  Fair  Insight:  Fair  Psychomotor Activity:  Normal  Concentration:  Concentration: Fair  Recall:  [employer] of Knowledge: Fair  Language: good, speaks pashto, and minimal english  Akathisia:  NA    AIMS (if indicated): not done  Assets:  Manufacturing systems engineer Desire for Improvement Housing Transportation  ADL's:  Intact  Cognition: WNL  Sleep:  Good   Metabolic Disorder Labs: No results found for: "HGBA1C", "MPG" No results found for: "PROLACTIN" No results found for: "CHOL", "TRIG", "HDL", "CHOLHDL", "VLDL", "LDLCALC" Lab Results  Component Value Date   TSH 1.130 10/01/2020    Therapeutic Level Labs: No results found for: "LITHIUM" No results found for: "VALPROATE" No results found for: "CBMZ"   Screenings: GAD-7    Flowsheet Row [facility] Office Visit from 10/01/2020 in [facility]  Total GAD-7 Score 7 0      PHQ2-9    Flowsheet Row Office Visit from 11/03/2022 in [facility] - A Dept Of [place]. [facility] Office Visit from 10/28/2021 in [facility] Office Visit from 05/29/2021 in [facility] Infect [facility] - A Dept Of [place]. [facility] Office Visit from 01/04/2021 in [facility] - [facility] Dept Of [facility] Office Visit from 12/10/2020 in [facility] - [facility] Dept Of [facility]  PHQ-2 Total Score 0 3 2 0 0  PHQ-9 Total Score -- 16 -- 0 0      Flowsheet Row UC from 04/09/2023 in [facility] UC from 01/31/2023 in [facility] UC from 10[facility]20/2022 in [facility] at [facility] RISK CATEGORY No Risk No Risk No Risk       Collaboration of Care: Collaboration of Care: Medication Management AEB medication prescription and Other provider involved in patient's care AEB urgent care and PCP chart review  Patient/Guardian was advised Release of Information must be obtained prior to any record release in order to collaborate their care with an outside provider. Patient/Guardian was advised if they have not already done so to contact the registration department to sign all necessary forms in order for us  to release information regarding their care.   Consent: Patient/Guardian gives verbal consent for treatment and assignment of benefits for services provided during this visit. Patient/Guardian expressed understanding and agreed to proceed.   40 minutes were spent in chart review, interview, psycho education, counseling, medical decision making, coordination of care and long-term prognosis.  Patient was given opportunity to ask question and all concerns and questions were addressed  and answers. Excluding  separately billable services.    Yves Herb, MD 05/18/2023, 8:59 AM

## 2023-05-19 ENCOUNTER — Ambulatory Visit (HOSPITAL_COMMUNITY): Admitting: Psychiatry

## 2023-05-19 ENCOUNTER — Ambulatory Visit (INDEPENDENT_AMBULATORY_CARE_PROVIDER_SITE_OTHER): Payer: Self-pay | Admitting: Student

## 2023-05-19 ENCOUNTER — Encounter: Payer: Self-pay | Admitting: Student

## 2023-05-19 ENCOUNTER — Other Ambulatory Visit: Payer: Self-pay

## 2023-05-19 ENCOUNTER — Ambulatory Visit: Payer: Self-pay | Admitting: Student

## 2023-05-19 VITALS — BP 105/65 | HR 65 | Temp 98.2°F | Ht 67.0 in | Wt 144.0 lb

## 2023-05-19 DIAGNOSIS — F330 Major depressive disorder, recurrent, mild: Secondary | ICD-10-CM

## 2023-05-19 DIAGNOSIS — F329 Major depressive disorder, single episode, unspecified: Secondary | ICD-10-CM | POA: Diagnosis not present

## 2023-05-19 DIAGNOSIS — F411 Generalized anxiety disorder: Secondary | ICD-10-CM

## 2023-05-19 DIAGNOSIS — H04123 Dry eye syndrome of bilateral lacrimal glands: Secondary | ICD-10-CM | POA: Insufficient documentation

## 2023-05-19 DIAGNOSIS — H9011 Conductive hearing loss, unilateral, right ear, with unrestricted hearing on the contralateral side: Secondary | ICD-10-CM

## 2023-05-19 DIAGNOSIS — Z3169 Encounter for other general counseling and advice on procreation: Secondary | ICD-10-CM

## 2023-05-19 MED ORDER — FAMOTIDINE 40 MG PO TABS
40.0000 mg | ORAL_TABLET | Freq: Every day | ORAL | 5 refills | Status: DC
Start: 2023-05-19 — End: 2023-10-12

## 2023-05-19 MED ORDER — MONTELUKAST SODIUM 10 MG PO TABS: 10.0000 mg | ORAL_TABLET | Freq: Every day | ORAL | 3 refills | Status: AC

## 2023-05-19 MED ORDER — FLUTICASONE PROPIONATE 50 MCG/ACT NA SUSP: 1.0000 | Freq: Every day | NASAL | 1 refills | Status: AC

## 2023-05-19 MED ORDER — BUDESONIDE-FORMOTEROL FUMARATE 160-4.5 MCG/ACT IN AERO: 2.0000 | INHALATION_SPRAY | RESPIRATORY_TRACT | 12 refills | Status: AC | PRN

## 2023-05-19 MED ORDER — ESCITALOPRAM OXALATE 5 MG PO TABS
5.0000 mg | ORAL_TABLET | Freq: Every day | ORAL | 11 refills | Status: AC
Start: 2023-05-19 — End: 2024-05-18

## 2023-05-19 MED ORDER — HYDROXYZINE HCL 10 MG PO TABS: 10.0000 mg | ORAL_TABLET | Freq: Three times a day (TID) | ORAL | 1 refills | Status: AC | PRN

## 2023-05-19 NOTE — Patient Instructions (Addendum)
 Eric Mcbride,Thank you for allowing me to take part in your care today.  Here are your instructions.  1.  Regarding your dry eyes and burning sensation, please take a daily antihistamine.  I printed off papers for you to see.  Also take daily eyedrop.  I am also getting some labs.  2.  I have referred you to urology for infertility.  3.  I have referred you back to ear nose and throat to make sure you get your hearing aids.    Thank you, Dr. Lydia Sams  If you have any other questions please contact the internal medicine clinic at [phone] If it is after hours, please call the [facility] at [phone] and then ask the person who picks up for the resident on call.

## 2023-05-19 NOTE — Progress Notes (Signed)
 CC: Burning eyes and hearing loss  HPI:  Eric Mcbride is a 33 y.o. male with a past medical history of asthma, GERD, depression who presents for acute visit with concerns of eyestrain as well as hearing loss.  Please see assessment and plan for full HPI.  Medications: Asthma: Albuterol  inhaler, Advair  1 puff twice daily Allergies: montelukast , Flonase , Singulair  10 mg nightly Depression/anxiety: Lexapro  5 mg daily, hydroxyzine  10 mg 3 times daily as needed, propranolol  5 mg twice daily as needed  Past Medical History:  Diagnosis Date   Acid reflux disease    Constipation    Dyspepsia    H. pylori infection      Current Outpatient Medications:    albuterol  (VENTOLIN  HFA) 108 (90 Base) MCG/ACT inhaler, Inhale 2 puffs into the lungs every 4 (four) hours as needed for wheezing or shortness of breath., Disp: 18 g, Rfl: 0   albuterol  (VENTOLIN  HFA) 108 (90 Base) MCG/ACT inhaler, Inhale 2 puffs into the lungs every 6 (six) hours as needed for wheezing or shortness of breath (Cough)., Disp: 18 g, Rfl: 1   budesonide -formoterol  (SYMBICORT ) 160-4.5 MCG/ACT inhaler, Inhale 2 puffs into the lungs as needed., Disp: 1 each, Rfl: 12   escitalopram  (LEXAPRO ) 5 MG tablet, Take 1 tablet (5 mg total) by mouth daily., Disp: 30 tablet, Rfl: 11   famotidine  (PEPCID ) 40 MG tablet, Take 1 tablet (40 mg total) by mouth daily., Disp: 30 tablet, Rfl: 5   fluticasone  (FLONASE ) 50 MCG/ACT nasal spray, Place 1 spray into both nostrils daily., Disp: 47.4 mL, Rfl: 1   hydrOXYzine  (ATARAX ) 10 MG tablet, Take 1 tablet (10 mg total) by mouth 3 (three) times daily as needed for anxiety., Disp: 30 tablet, Rfl: 1   montelukast  (SINGULAIR ) 10 MG tablet, Take 1 tablet (10 mg total) by mouth at bedtime., Disp: 90 tablet, Rfl: 3   propranolol  (INDERAL ) 10 MG tablet, Take 0.5 tablets (5 mg total) by mouth 2 (two) times daily as needed (anxiety)., Disp: 30 tablet, Rfl: 2  Review of Systems:   Eye: Patient endorses  burning sensation in eyes Ears: Patient endorses hearing loss  Physical Exam:  Vitals:   05/19/23 1402  BP: 105/65  Pulse: 65  Temp: 98.2 F (36.8 C)  TempSrc: Oral  SpO2: 99%  Weight: 144 lb (65.3 kg)  Height: 5\' 7"  (1.702 m)   General: Patient is sitting comfortably in the room  Eyes: Pupils equal and reactive to light, EOM intact  Head: Normocephalic, atraumatic  ENT: Right tympanic membrane perforation appreciated, left tympanic membrane intact.  Ear canals intact bilaterally   Assessment & Plan:   Dry eyes Patient presents with 29-month history of dry eyes as well as burning sensation in eyes.  He states it is worse in the morning when he wakes up.  He reports his eyes are red and he has clear drainage.  He states he gets better throughout the day.  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  He has not tried any medications for this. He does not have an optometrist.  He denies any blurry vision.  He denies any headaches.  This is likely allergic conjunctivitis, but would like to rule out Sjogren syndrome.  Will check antibodies.  Plan: - Check Sjogren antibodies - Recommend daily antihistamine - Recommend daily eyedrops lubrication -Recommended patient to go to optometrist  Conductive hearing loss of right ear with unrestricted hearing  of left ear Patient has previously been seen by ENT for concerns of conductive hearing loss of right ear with unrestricted hearing of left ear.  He was recommended to either tympanoplasty or amplification, but he wanted observation.  He is now presenting with concerns of hearing loss.  He wants to know what he could do.  I told him he can get hearing aids.  He agreed to this.  I will refer him back to ENT for this.  Plan: - Refer back to ENT Dr. Lydia Sams  GAD (generalized anxiety disorder) No acute concerns at this time.  He continues to follow with behavioral health.  Plan: -  Continue hydroxyzine  10 mg 3 times daily as needed - Continue Lexapro  5 mg daily - Continue propranolol  5 mg twice daily - Continue follow-up behavioral health  Infertility counseling Patient states he has been trying to conceive, but is having trouble.  He would like to be evaluated for this.  Plan: - Refer to urology for fertility evaluation  MDD (major depressive disorder) No acute concerns today.    Plan: - Continue Lexapro  5 mg daily - Continue hydroxyzine  10 mg 3 times daily as needed - Continue propranolol  5 mg twice daily as needed  Patient discussed with Dr. Sonia Durand, DO PGY-2 Internal Medicine Resident

## 2023-05-19 NOTE — Assessment & Plan Note (Signed)
 Patient states he has been trying to conceive, but is having trouble.  He would like to be evaluated for this.  Plan: - Refer to urology for fertility evaluation

## 2023-05-19 NOTE — Assessment & Plan Note (Signed)
 No acute concerns today.  Plan: -Continue Lexapro 5 mg daily -Continue hydroxyzine 10 mg 3 times daily as needed -Continue propranolol 5 mg twice daily as needed

## 2023-05-19 NOTE — Assessment & Plan Note (Signed)
 Patient has previously been seen by ENT for concerns of conductive hearing loss of right ear with unrestricted hearing of left ear.  He was recommended to either tympanoplasty or amplification, but he wanted observation.  He is now presenting with concerns of hearing loss.  He wants to know what he could do.  I told him he can get hearing aids.  He agreed to this.  I will refer him back to ENT for this.  Plan: - Refer back to ENT Dr. Lydia Sams

## 2023-05-19 NOTE — Assessment & Plan Note (Addendum)
 Patient presents with 36-month history of dry eyes as well as burning sensation in eyes.  He states it is worse in the morning when he wakes up.  He reports his eyes are red and he has clear drainage.  He states he gets better throughout the day.  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  He has not tried any medications for this. He does not have an optometrist.  He denies any blurry vision.  He denies any headaches.  This is likely allergic conjunctivitis, but would like to rule out Sjogren syndrome.  Will check antibodies.  Plan: - Check Sjogren antibodies - Recommend daily antihistamine - Recommend daily eyedrops lubrication -Recommended patient to go to optometrist

## 2023-05-19 NOTE — Assessment & Plan Note (Signed)
 No acute concerns at this time.  He continues to follow with behavioral health.  Plan: - Continue hydroxyzine  10 mg 3 times daily as needed - Continue Lexapro  5 mg daily - Continue propranolol  5 mg twice daily - Continue follow-up behavioral health

## 2023-05-20 ENCOUNTER — Ambulatory Visit: Payer: Self-pay | Admitting: Student

## 2023-05-20 LAB — SJOGRENS SYNDROME-B EXTRACTABLE NUCLEAR ANTIBODY: ENA SSB (LA) Ab: <0.2 AI (ref 0.0–0.9)

## 2023-05-20 LAB — SJOGRENS SYNDROME-A EXTRACTABLE NUCLEAR ANTIBODY: ENA SSA (RO) Ab: <0.2 AI (ref 0.0–0.9)

## 2023-05-20 NOTE — Progress Notes (Signed)
 Internal Medicine Clinic Attending  Case discussed with the resident at the time of the visit.  We reviewed the resident's history and exam and pertinent patient test results.  I agree with the assessment, diagnosis, and plan of care documented in the resident's note.

## 2023-05-26 ENCOUNTER — Ambulatory Visit: Payer: Self-pay | Admitting: Internal Medicine

## 2023-06-09 ENCOUNTER — Encounter (INDEPENDENT_AMBULATORY_CARE_PROVIDER_SITE_OTHER): Payer: Self-pay

## 2023-06-25 ENCOUNTER — Encounter (INDEPENDENT_AMBULATORY_CARE_PROVIDER_SITE_OTHER): Payer: Self-pay

## 2023-07-04 ENCOUNTER — Encounter (HOSPITAL_COMMUNITY): Payer: Self-pay

## 2023-07-04 ENCOUNTER — Emergency Department (HOSPITAL_COMMUNITY)

## 2023-07-04 ENCOUNTER — Other Ambulatory Visit: Payer: Self-pay

## 2023-07-04 ENCOUNTER — Emergency Department (HOSPITAL_COMMUNITY)
Admission: EM | Admit: 2023-07-04 | Discharge: 2023-07-04 | Disposition: A | Discharge status: Home / Self Care | Attending: Student | Admitting: Student

## 2023-07-04 DIAGNOSIS — M542 Cervicalgia: Secondary | ICD-10-CM | POA: Insufficient documentation

## 2023-07-04 DIAGNOSIS — R42 Dizziness and giddiness: Secondary | ICD-10-CM | POA: Diagnosis not present

## 2023-07-04 DIAGNOSIS — J329 Chronic sinusitis, unspecified: Secondary | ICD-10-CM | POA: Insufficient documentation

## 2023-07-04 DIAGNOSIS — Y92410 Unspecified street and highway as the place of occurrence of the external cause: Secondary | ICD-10-CM | POA: Diagnosis not present

## 2023-07-04 DIAGNOSIS — B9689 Other specified bacterial agents as the cause of diseases classified elsewhere: Secondary | ICD-10-CM

## 2023-07-04 MED ORDER — DOXYCYCLINE HYCLATE 100 MG PO CAPS: 100.0000 mg | ORAL_CAPSULE | Freq: Two times a day (BID) | ORAL | 0 refills | Status: AC

## 2023-07-04 MED ORDER — OXYCODONE-ACETAMINOPHEN 5-325 MG PO TABS
1.0000 | ORAL_TABLET | Freq: Once | ORAL | Status: AC
  Administered 2023-07-04: 1 via ORAL
  Filled 2023-07-04: qty 1

## 2023-07-04 NOTE — ED Notes (Signed)
 Patient transported to CT
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
## 2023-07-04 NOTE — ED Notes (Addendum)
 disregard
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
## 2023-07-06 ENCOUNTER — Encounter: Payer: Self-pay | Admitting: Student

## 2023-07-15 ENCOUNTER — Ambulatory Visit

## 2023-07-15 ENCOUNTER — Other Ambulatory Visit: Payer: Self-pay

## 2023-07-15 VITALS — BP 128/81 | HR 68 | Temp 98.3°F | Ht 68.0 in | Wt 153.4 lb

## 2023-07-15 DIAGNOSIS — H04123 Dry eye syndrome of bilateral lacrimal glands: Secondary | ICD-10-CM

## 2023-07-15 DIAGNOSIS — L299 Pruritus, unspecified: Secondary | ICD-10-CM | POA: Diagnosis not present

## 2023-07-15 MED ORDER — OLOPATADINE HCL 0.2 % OP SOLN: 1.0000 [drp] | Freq: Once | OPHTHALMIC | 0 refills | Status: AC

## 2023-07-15 MED ORDER — HYPROMELLOSE (GONIOSCOPIC) 2.5 % OP SOLN: 1.0000 [drp] | Freq: Three times a day (TID) | OPHTHALMIC | 3 refills | Status: AC

## 2023-07-15 MED ORDER — CIPROFLOXACIN HCL 0.3 % OP SOLN: 1.0000 [drp] | OPHTHALMIC | Status: AC

## 2023-07-15 NOTE — Patient Instructions (Addendum)
????? ????? ? ° °?? ? ???? ???? ?? ?? ?? ???? ?? ?????? ?? ?????.  ° °?. ??? ?? ???? ???????????? ?? ??? ?? ?? ??? ?????? ??? ?? ???? ?? ???? ?????? ?? ??? ?? ????? ? ??? ???? ????. ?? ?? ???? ?? ? ????? ??? ??? ???? ??? ?? ?? ??? ????. °?. ??????? ???? ?? ? ????? ?? ????? ???? ? ?????? ????? ?????. ?? ???? ?? ????? ???? ?? ??????? ???? ???? ?????? ?? ??? ???? ?? ??? ???? ?? ? ????? ????? ????? ???? ??? ?????? ??.  ° °??????? ???? ???? ?????? ?? ??   [phone] ??? ???? ?? ???? ???? ?????? ?? ??????? ???. ? ??? ????? ????? ?? ???? ?? ??? ? ?????? ? ???? ??? ?? ? ???? ? ? ??? ??? ?? ? ??? ????? ?? ?? ??? ??? ?? ?? ???? ????? ?? ??/? ????? ??. ?? ???? ? ????? ??? ????? ?? ????? ???? ??????? ???? ??? ???? ?????? ??? ??????? ?? ??? ????? ?? ??? ?? ??? ?? ??? ?????? ????????. °  °???? ?? ???? ?? ?? ????? ?? ????? ?? ? ???? ?????? ????? ?????? ????. ???? ????? ? ?? ?? ???? ?? ????? ?? ??? ??! ° °
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
## 2023-07-15 NOTE — Assessment & Plan Note (Signed)
 Assessment: Persistent symptoms consistent with allergic conjunctivitis remain likely. Sjogren syndrome ruled out, Plan: Artificial tear and pataday  sunglass oral antihistamines  daily lubricating eye drops regularly. Ophthalmology referal Educate patient on eye hygiene and avoidance of known allergens/irritants. Schedule follow-up in 4-6 weeks to reassess symptoms and treatment response.

## 2023-08-11 ENCOUNTER — Encounter: Payer: Self-pay | Admitting: *Deleted

## 2023-08-13 ENCOUNTER — Encounter: Payer: Self-pay | Admitting: *Deleted

## 2023-08-13 NOTE — Congregational Nurse Program (Signed)
  Dept: [phone]   Congregational Nurse Program Note  Date of Encounter: 08/11/2023  Past Medical History: Past Medical History:  Diagnosis Date   Acid reflux disease    Constipation    Dyspepsia    H. pylori infection     Encounter Details:  Community Questionnaire - 08/11/23 1540       Questionnaire   Ask client: Do you give verbal consent for me to treat you today? Yes    Student Assistance N/A    Location [facility]    Encounter Setting Home    Population Status Migrant/Refugee    Insurance Medicaid    Insurance/Financial Assistance Referral N/A    Medication N/A    Medical Provider Yes    Screening Referrals Made N/A    Medical Referrals Made N/A    Medical Appointment Completed [facility]    CNP Interventions Advocate/Support;Navigate Healthcare System;Counsel;Educate    Screenings CN Performed N/A    ED Visit Averted Yes    Life-Saving Intervention Made N/A         Met client in his home for follow-up.  Client continues to have issues with dry/burning eyes.  He has been using drops recommended by PCP but without relief.  Discussed additional over the counter products for dry eyes.  Will assist client in obtaining additional drops.  Also discussed upcoming appointment with fertility clinic.  Went over directions with client.  Appointment is pending for August 14.  Will follow up with client as needed.  Eric Ropes, RN, MSN, CNP [phone] Office [phone] Cell

## 2023-08-14 NOTE — Congregational Nurse Program (Signed)
  Dept: [phone]   Congregational Nurse Program Note  Date of Encounter: 08/13/2023  Past Medical History: Past Medical History:  Diagnosis Date   Acid reflux disease    Constipation    Dyspepsia    H. pylori infection     Encounter Details:  Community Questionnaire - 08/13/23 1700       Questionnaire   Ask client: Do you give verbal consent for me to treat you today? Yes    Student Assistance N/A    Location Patient Served  [facility]    Encounter Setting Home    Population Status Migrant/Refugee    Insurance Medicaid    Insurance/Financial Assistance Referral N/A    Medication N/A    Medical Provider Yes    Screening Referrals Made N/A    Medical Referrals Made N/A    Medical Appointment Completed N/A    CNP Interventions Advocate/Support;Navigate Healthcare System;Counsel;Educate    Screenings CN Performed N/A    ED Visit Averted Yes    Life-Saving Intervention Made N/A         Delivered pharmacy obtained eye drops to client that address moderate and severe dry eye.  He will try the drops and then follow up with this CN.    Lugene Ropes, RN, MSN, CNP [phone] Office [phone] Cell

## 2023-09-01 ENCOUNTER — Encounter: Payer: Self-pay | Admitting: *Deleted
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
## 2023-09-02 ENCOUNTER — Encounter: Payer: Self-pay | Admitting: Student

## 2023-09-02 DIAGNOSIS — Z3141 Encounter for fertility testing: Secondary | ICD-10-CM

## 2023-09-02 DIAGNOSIS — Z3169 Encounter for other general counseling and advice on procreation: Secondary | ICD-10-CM

## 2023-09-03 NOTE — Progress Notes (Addendum)
    Patient Follow-Up Note:  I received a message from RN Lovett Ropes regarding Mr. Vella referral to [facility] for infertility. I contacted the patient this morning to follow up and address his frustration.  After a shared decision-making discussion, the patient agreed to contact his insurance provider to obtain a list of in-network urologists. He will call our office back once he has that information so that we can reissue the referral accordingly. Will also have our referral coordinator follow up on this as well.   Drue Grow, M.D [facility] Internal Medicine Phone: [phone] Date 09/03/2023 Time 10:07 AM

## 2023-10-12 ENCOUNTER — Other Ambulatory Visit: Payer: Self-pay | Admitting: Student

## 2023-10-17 ENCOUNTER — Other Ambulatory Visit: Payer: Self-pay | Admitting: Student

## 2023-10-19 NOTE — Telephone Encounter (Signed)
 Medication discontinued 02/06/23.

## 2023-12-17 ENCOUNTER — Ambulatory Visit (INDEPENDENT_AMBULATORY_CARE_PROVIDER_SITE_OTHER): Payer: Commercial Managed Care - HMO | Admitting: Otolaryngology

## 2023-12-17 ENCOUNTER — Encounter (INDEPENDENT_AMBULATORY_CARE_PROVIDER_SITE_OTHER): Payer: Self-pay | Admitting: Otolaryngology

## 2023-12-17 VITALS — BP 140/83 | HR 82 | Ht 68.0 in

## 2023-12-17 DIAGNOSIS — H9011 Conductive hearing loss, unilateral, right ear, with unrestricted hearing on the contralateral side: Secondary | ICD-10-CM

## 2023-12-17 DIAGNOSIS — H608X1 Other otitis externa, right ear: Secondary | ICD-10-CM

## 2023-12-17 DIAGNOSIS — H7291 Unspecified perforation of tympanic membrane, right ear: Secondary | ICD-10-CM | POA: Diagnosis not present

## 2023-12-17 MED ORDER — FLUOCINOLONE ACETONIDE 0.01 % OT OIL: 4.0000 [drp] | TOPICAL_OIL | Freq: Two times a day (BID) | OTIC | 1 refills | Status: AC

## 2023-12-17 MED ORDER — BETAMETHASONE DIPROPIONATE 0.05 % EX CREA: TOPICAL_CREAM | Freq: Two times a day (BID) | CUTANEOUS | 0 refills | Status: AC
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
## 2023-12-17 NOTE — Patient Instructions (Addendum)
 Use dermotic oil 4 drops right ear for 2 weeks Then, use betamethasone ointment just to outside of the ear canal two times per day

## 2024-06-16 ENCOUNTER — Ambulatory Visit (INDEPENDENT_AMBULATORY_CARE_PROVIDER_SITE_OTHER): Admitting: Otolaryngology

## 2024-06-30 ENCOUNTER — Ambulatory Visit (INDEPENDENT_AMBULATORY_CARE_PROVIDER_SITE_OTHER): Admitting: Audiology

## 2024-06-30 ENCOUNTER — Ambulatory Visit (INDEPENDENT_AMBULATORY_CARE_PROVIDER_SITE_OTHER): Admitting: Otolaryngology
